# Patient Record
Sex: Male | Born: 1945 | Race: White | Hispanic: No | Marital: Married | Smoking: Former smoker
Health system: Southern US, Community
[De-identification: ages and names within clinical notes are randomized; demographics above are authoritative.]

## PROBLEM LIST (undated history)

## (undated) DIAGNOSIS — E559 Vitamin D deficiency, unspecified: Secondary | ICD-10-CM

## (undated) DIAGNOSIS — N400 Enlarged prostate without lower urinary tract symptoms: Secondary | ICD-10-CM

## (undated) DIAGNOSIS — E039 Hypothyroidism, unspecified: Secondary | ICD-10-CM

## (undated) DIAGNOSIS — N179 Acute kidney failure, unspecified: Secondary | ICD-10-CM

## (undated) DIAGNOSIS — I509 Heart failure, unspecified: Secondary | ICD-10-CM

## (undated) DIAGNOSIS — E538 Deficiency of other specified B group vitamins: Secondary | ICD-10-CM

## (undated) DIAGNOSIS — K5792 Diverticulitis of intestine, part unspecified, without perforation or abscess without bleeding: Secondary | ICD-10-CM

## (undated) DIAGNOSIS — D509 Iron deficiency anemia, unspecified: Secondary | ICD-10-CM

## (undated) DIAGNOSIS — I340 Nonrheumatic mitral (valve) insufficiency: Secondary | ICD-10-CM

## (undated) DIAGNOSIS — R748 Abnormal levels of other serum enzymes: Secondary | ICD-10-CM

## (undated) DIAGNOSIS — I499 Cardiac arrhythmia, unspecified: Secondary | ICD-10-CM

## (undated) DIAGNOSIS — I4891 Unspecified atrial fibrillation: Secondary | ICD-10-CM

## (undated) DIAGNOSIS — I429 Cardiomyopathy, unspecified: Secondary | ICD-10-CM

## (undated) DIAGNOSIS — I959 Hypotension, unspecified: Secondary | ICD-10-CM

## (undated) HISTORY — DX: Nonrheumatic mitral (valve) insufficiency: I34.0

## (undated) HISTORY — DX: Heart failure, unspecified: I50.9

## (undated) HISTORY — DX: Unspecified atrial fibrillation: I48.91

## (undated) HISTORY — DX: Vitamin D deficiency, unspecified: E55.9

## (undated) HISTORY — PX: HERNIA REPAIR: SHX51

## (undated) HISTORY — PX: CATARACT EXTRACTION: SUR2

## (undated) HISTORY — DX: Hypotension, unspecified: I95.9

## (undated) HISTORY — DX: Cardiomyopathy, unspecified: I42.9

## (undated) HISTORY — DX: Diverticulitis of intestine, part unspecified, without perforation or abscess without bleeding: K57.92

## (undated) HISTORY — DX: Abnormal levels of other serum enzymes: R74.8

## (undated) HISTORY — DX: Acute kidney failure, unspecified: N17.9

## (undated) HISTORY — DX: Cardiac arrhythmia, unspecified: I49.9

## (undated) HISTORY — DX: Hypothyroidism, unspecified: E03.9

## (undated) HISTORY — DX: Deficiency of other specified B group vitamins: E53.8

## (undated) HISTORY — DX: Benign prostatic hyperplasia without lower urinary tract symptoms: N40.0

## (undated) HISTORY — DX: Iron deficiency anemia, unspecified: D50.9

---

## 2013-08-28 DIAGNOSIS — H269 Unspecified cataract: Secondary | ICD-10-CM | POA: Insufficient documentation

## 2013-08-28 HISTORY — DX: Unspecified cataract: H26.9

## 2016-02-28 DIAGNOSIS — E559 Vitamin D deficiency, unspecified: Secondary | ICD-10-CM | POA: Diagnosis not present

## 2016-02-28 DIAGNOSIS — R739 Hyperglycemia, unspecified: Secondary | ICD-10-CM | POA: Diagnosis not present

## 2016-02-28 DIAGNOSIS — R972 Elevated prostate specific antigen [PSA]: Secondary | ICD-10-CM | POA: Diagnosis not present

## 2016-02-28 DIAGNOSIS — E063 Autoimmune thyroiditis: Secondary | ICD-10-CM | POA: Diagnosis not present

## 2016-02-28 DIAGNOSIS — R413 Other amnesia: Secondary | ICD-10-CM | POA: Diagnosis not present

## 2016-02-28 DIAGNOSIS — E538 Deficiency of other specified B group vitamins: Secondary | ICD-10-CM | POA: Diagnosis not present

## 2016-02-28 DIAGNOSIS — Z79899 Other long term (current) drug therapy: Secondary | ICD-10-CM | POA: Diagnosis not present

## 2016-05-29 DIAGNOSIS — E538 Deficiency of other specified B group vitamins: Secondary | ICD-10-CM | POA: Diagnosis not present

## 2016-05-29 DIAGNOSIS — Z1389 Encounter for screening for other disorder: Secondary | ICD-10-CM | POA: Diagnosis not present

## 2016-05-29 DIAGNOSIS — Z6823 Body mass index (BMI) 23.0-23.9, adult: Secondary | ICD-10-CM | POA: Diagnosis not present

## 2016-05-29 DIAGNOSIS — E559 Vitamin D deficiency, unspecified: Secondary | ICD-10-CM | POA: Diagnosis not present

## 2016-05-29 DIAGNOSIS — R413 Other amnesia: Secondary | ICD-10-CM | POA: Diagnosis not present

## 2016-05-29 DIAGNOSIS — E063 Autoimmune thyroiditis: Secondary | ICD-10-CM | POA: Diagnosis not present

## 2016-05-29 DIAGNOSIS — Z9181 History of falling: Secondary | ICD-10-CM | POA: Diagnosis not present

## 2016-10-02 DIAGNOSIS — E063 Autoimmune thyroiditis: Secondary | ICD-10-CM | POA: Diagnosis not present

## 2016-10-02 DIAGNOSIS — E538 Deficiency of other specified B group vitamins: Secondary | ICD-10-CM | POA: Diagnosis not present

## 2016-10-02 DIAGNOSIS — E663 Overweight: Secondary | ICD-10-CM | POA: Diagnosis not present

## 2016-10-02 DIAGNOSIS — Z6825 Body mass index (BMI) 25.0-25.9, adult: Secondary | ICD-10-CM | POA: Diagnosis not present

## 2016-10-02 DIAGNOSIS — R739 Hyperglycemia, unspecified: Secondary | ICD-10-CM | POA: Diagnosis not present

## 2016-10-02 DIAGNOSIS — E559 Vitamin D deficiency, unspecified: Secondary | ICD-10-CM | POA: Diagnosis not present

## 2016-10-02 DIAGNOSIS — R413 Other amnesia: Secondary | ICD-10-CM | POA: Diagnosis not present

## 2016-10-02 DIAGNOSIS — Z79899 Other long term (current) drug therapy: Secondary | ICD-10-CM | POA: Diagnosis not present

## 2016-11-30 DIAGNOSIS — Z961 Presence of intraocular lens: Secondary | ICD-10-CM | POA: Diagnosis not present

## 2016-11-30 DIAGNOSIS — H25042 Posterior subcapsular polar age-related cataract, left eye: Secondary | ICD-10-CM | POA: Diagnosis not present

## 2016-11-30 DIAGNOSIS — H2512 Age-related nuclear cataract, left eye: Secondary | ICD-10-CM | POA: Diagnosis not present

## 2016-11-30 DIAGNOSIS — H35372 Puckering of macula, left eye: Secondary | ICD-10-CM | POA: Diagnosis not present

## 2016-11-30 DIAGNOSIS — H25012 Cortical age-related cataract, left eye: Secondary | ICD-10-CM | POA: Diagnosis not present

## 2017-01-21 DIAGNOSIS — H2512 Age-related nuclear cataract, left eye: Secondary | ICD-10-CM

## 2017-01-21 HISTORY — DX: Age-related nuclear cataract, left eye: H25.12

## 2017-01-24 DIAGNOSIS — Z87891 Personal history of nicotine dependence: Secondary | ICD-10-CM | POA: Diagnosis not present

## 2017-01-24 DIAGNOSIS — H2512 Age-related nuclear cataract, left eye: Secondary | ICD-10-CM | POA: Diagnosis not present

## 2017-01-24 DIAGNOSIS — Z7982 Long term (current) use of aspirin: Secondary | ICD-10-CM | POA: Diagnosis not present

## 2017-01-24 DIAGNOSIS — K5730 Diverticulosis of large intestine without perforation or abscess without bleeding: Secondary | ICD-10-CM | POA: Diagnosis not present

## 2017-01-24 DIAGNOSIS — Z79899 Other long term (current) drug therapy: Secondary | ICD-10-CM | POA: Diagnosis not present

## 2017-01-24 DIAGNOSIS — E039 Hypothyroidism, unspecified: Secondary | ICD-10-CM | POA: Diagnosis not present

## 2017-02-01 DIAGNOSIS — J9801 Acute bronchospasm: Secondary | ICD-10-CM | POA: Diagnosis not present

## 2017-02-01 DIAGNOSIS — Z6826 Body mass index (BMI) 26.0-26.9, adult: Secondary | ICD-10-CM | POA: Diagnosis not present

## 2017-02-01 DIAGNOSIS — E063 Autoimmune thyroiditis: Secondary | ICD-10-CM | POA: Diagnosis not present

## 2017-02-01 DIAGNOSIS — E559 Vitamin D deficiency, unspecified: Secondary | ICD-10-CM | POA: Diagnosis not present

## 2017-02-01 DIAGNOSIS — Z79899 Other long term (current) drug therapy: Secondary | ICD-10-CM | POA: Diagnosis not present

## 2017-02-01 DIAGNOSIS — J189 Pneumonia, unspecified organism: Secondary | ICD-10-CM | POA: Diagnosis not present

## 2017-06-04 DIAGNOSIS — E559 Vitamin D deficiency, unspecified: Secondary | ICD-10-CM | POA: Diagnosis not present

## 2017-06-04 DIAGNOSIS — Z1331 Encounter for screening for depression: Secondary | ICD-10-CM | POA: Diagnosis not present

## 2017-06-04 DIAGNOSIS — E538 Deficiency of other specified B group vitamins: Secondary | ICD-10-CM | POA: Diagnosis not present

## 2017-06-04 DIAGNOSIS — E063 Autoimmune thyroiditis: Secondary | ICD-10-CM | POA: Diagnosis not present

## 2017-06-04 DIAGNOSIS — M7989 Other specified soft tissue disorders: Secondary | ICD-10-CM | POA: Diagnosis not present

## 2017-06-04 DIAGNOSIS — R413 Other amnesia: Secondary | ICD-10-CM | POA: Diagnosis not present

## 2017-06-04 DIAGNOSIS — R739 Hyperglycemia, unspecified: Secondary | ICD-10-CM | POA: Diagnosis not present

## 2017-06-04 DIAGNOSIS — Z79899 Other long term (current) drug therapy: Secondary | ICD-10-CM | POA: Diagnosis not present

## 2017-06-04 DIAGNOSIS — E663 Overweight: Secondary | ICD-10-CM | POA: Diagnosis not present

## 2017-06-04 DIAGNOSIS — Z9181 History of falling: Secondary | ICD-10-CM | POA: Diagnosis not present

## 2017-06-05 DIAGNOSIS — M19071 Primary osteoarthritis, right ankle and foot: Secondary | ICD-10-CM | POA: Diagnosis not present

## 2017-06-05 DIAGNOSIS — M7989 Other specified soft tissue disorders: Secondary | ICD-10-CM | POA: Diagnosis not present

## 2017-08-15 ENCOUNTER — Encounter: Payer: Self-pay | Admitting: Family Medicine

## 2017-08-15 DIAGNOSIS — R7989 Other specified abnormal findings of blood chemistry: Secondary | ICD-10-CM | POA: Diagnosis not present

## 2017-08-15 DIAGNOSIS — E039 Hypothyroidism, unspecified: Secondary | ICD-10-CM | POA: Diagnosis not present

## 2017-08-15 DIAGNOSIS — I509 Heart failure, unspecified: Secondary | ICD-10-CM | POA: Diagnosis not present

## 2017-08-15 DIAGNOSIS — Z6825 Body mass index (BMI) 25.0-25.9, adult: Secondary | ICD-10-CM | POA: Diagnosis not present

## 2017-08-15 DIAGNOSIS — R0602 Shortness of breath: Secondary | ICD-10-CM | POA: Diagnosis not present

## 2017-08-15 DIAGNOSIS — R0902 Hypoxemia: Secondary | ICD-10-CM | POA: Diagnosis not present

## 2017-08-15 DIAGNOSIS — J9601 Acute respiratory failure with hypoxia: Secondary | ICD-10-CM | POA: Diagnosis not present

## 2017-08-15 DIAGNOSIS — K7200 Acute and subacute hepatic failure without coma: Secondary | ICD-10-CM | POA: Diagnosis not present

## 2017-08-15 DIAGNOSIS — E872 Acidosis: Secondary | ICD-10-CM | POA: Diagnosis not present

## 2017-08-15 DIAGNOSIS — R531 Weakness: Secondary | ICD-10-CM | POA: Diagnosis not present

## 2017-08-15 DIAGNOSIS — R570 Cardiogenic shock: Secondary | ICD-10-CM | POA: Diagnosis not present

## 2017-08-15 DIAGNOSIS — I491 Atrial premature depolarization: Secondary | ICD-10-CM | POA: Diagnosis not present

## 2017-08-15 DIAGNOSIS — R002 Palpitations: Secondary | ICD-10-CM | POA: Diagnosis not present

## 2017-08-15 DIAGNOSIS — K5792 Diverticulitis of intestine, part unspecified, without perforation or abscess without bleeding: Secondary | ICD-10-CM | POA: Diagnosis not present

## 2017-08-15 DIAGNOSIS — R000 Tachycardia, unspecified: Secondary | ICD-10-CM | POA: Diagnosis not present

## 2017-08-15 DIAGNOSIS — I4891 Unspecified atrial fibrillation: Secondary | ICD-10-CM | POA: Diagnosis not present

## 2017-08-15 DIAGNOSIS — I5021 Acute systolic (congestive) heart failure: Secondary | ICD-10-CM | POA: Diagnosis not present

## 2017-08-15 DIAGNOSIS — N179 Acute kidney failure, unspecified: Secondary | ICD-10-CM | POA: Diagnosis not present

## 2017-08-15 DIAGNOSIS — R748 Abnormal levels of other serum enzymes: Secondary | ICD-10-CM | POA: Diagnosis not present

## 2017-08-16 DIAGNOSIS — R0602 Shortness of breath: Secondary | ICD-10-CM

## 2017-08-16 DIAGNOSIS — I4891 Unspecified atrial fibrillation: Secondary | ICD-10-CM

## 2017-08-16 DIAGNOSIS — I509 Heart failure, unspecified: Secondary | ICD-10-CM

## 2017-08-18 DIAGNOSIS — I4891 Unspecified atrial fibrillation: Secondary | ICD-10-CM | POA: Diagnosis not present

## 2017-08-18 DIAGNOSIS — I509 Heart failure, unspecified: Secondary | ICD-10-CM

## 2017-08-21 DIAGNOSIS — R945 Abnormal results of liver function studies: Secondary | ICD-10-CM | POA: Diagnosis not present

## 2017-08-21 DIAGNOSIS — E538 Deficiency of other specified B group vitamins: Secondary | ICD-10-CM | POA: Diagnosis not present

## 2017-08-21 DIAGNOSIS — E063 Autoimmune thyroiditis: Secondary | ICD-10-CM | POA: Diagnosis not present

## 2017-08-21 DIAGNOSIS — I4891 Unspecified atrial fibrillation: Secondary | ICD-10-CM | POA: Diagnosis not present

## 2017-08-21 DIAGNOSIS — Z79899 Other long term (current) drug therapy: Secondary | ICD-10-CM | POA: Diagnosis not present

## 2017-08-21 DIAGNOSIS — D649 Anemia, unspecified: Secondary | ICD-10-CM | POA: Diagnosis not present

## 2017-08-21 DIAGNOSIS — I509 Heart failure, unspecified: Secondary | ICD-10-CM | POA: Diagnosis not present

## 2017-08-21 DIAGNOSIS — K5792 Diverticulitis of intestine, part unspecified, without perforation or abscess without bleeding: Secondary | ICD-10-CM | POA: Diagnosis not present

## 2017-08-21 DIAGNOSIS — E559 Vitamin D deficiency, unspecified: Secondary | ICD-10-CM | POA: Diagnosis not present

## 2017-08-27 NOTE — Progress Notes (Signed)
Cardiology Office Note:    Date:  08/29/2017   ID:  Jonathan Patterson, DOB April 02, 1945, MRN [id number]  PCP:  Guadalupe Maple., MD  Cardiologist:  Norman Herrlich, MD   Referring MD: No ref. provider found  ASSESSMENT:    1. PAF (paroxysmal atrial fibrillation) (HCC)   2. Chronic systolic heart failure (HCC)   3. Dilated cardiomyopathy (HCC)    PLAN:    In order of problems listed above:  1. Stable he remains in sinus rhythm we will continue his current anticoagulant 2. Compensated he has no fluid overload New York Heart Association class II continue his current medical treatment and as discussed in hospital referral for cardiac CTA to evaluate CAD although clinically his cardiomyopathy appeared to be secondary to rapid atrial fibrillation we will check renal function and proBNP level today 3. Continue current medical treatment loop diuretic beta-blocker ACE inhibitor and MRA recheck ejection fraction by echo at his next visit.  If unimproved will need to consider the merits of referral for ICD  Next 6 weeks after CTA   Medication Adjustments/Labs and Tests Ordered: Current medicines are reviewed at length with the patient today.  Concerns regarding medicines are outlined above.  Orders Placed This Encounter  Procedures  . CT CORONARY MORPH W/CTA COR W/SCORE W/CA W/CM &/OR WO/CM  . CT CORONARY FRACTIONAL FLOW RESERVE DATA PREP  . CT CORONARY FRACTIONAL FLOW RESERVE FLUID ANALYSIS  . Basic metabolic panel  . Pro b natriuretic peptide (BNP)  . EKG 12-Lead   No orders of the defined types were placed in this encounter.    Chief Complaint  Patient presents with  . Hospitalization Follow-up    recent [facility] admisssion  . Congestive Heart Failure  . Atrial Fibrillation    History of Present Illness:    Jonathan Patterson is a 72 y.o. male who is being seen today for the evaluation of heart failuer and atrial fibrillation after [facility] admission 08/15/17.  Recent [facility] admission  08/15/2017 he was in atrial fibrillation with a rapid ventricular response decompensated heart failure acute kidney injury acute liver injury and with control of his heart rate and treatment of heart failure he improved in hospital.  He remained in atrial fibrillation rate controlled his initial BNP level 9960 prior to discharge 1630 liver function improved the transaminases were still elevated at the time of discharge from the hospital he was anticoagulated with the plan to come back and see me in the office in anticipation of outpatient cardioversion to sinus rhythm.  While hospitalized an echocardiogram was performed ejection fraction was less than 20% .  Overall he has felt well but he fatigues easily tries to do activities outdoor and hot humid weather.  He is short of breath doing gardening work no edema orthopnea chest pain palpitation or syncope weighs daily and his weight is down 45 pounds since hospital discharge he restrict sodium in his diet and his wife supervises medications. Past Medical History:  Diagnosis Date  . Acute kidney injury (HCC)   . Arrhythmia   . Atrial fibrillation with RVR (HCC)   . Cardiomyopathy (HCC)   . Congestive heart failure (CHF) (HCC)   . Diverticulitis   . Elevated liver enzymes   . Hypotension   . Hypothyroid   . Iron deficiency anemia   . Mitral regurgitation     Past Surgical History:  Procedure Laterality Date  . CATARACT EXTRACTION Bilateral     Current Medications: Current Meds  Medication Sig  . Calcium Carb-Cholecalciferol (CALCIUM + D3 PO) Take 1 tablet by mouth daily.  . Cyanocobalamin (VITAMIN B12) 1000 MCG TBCR Take 1 tablet by mouth daily.  Marland Kitchen. ELIQUIS 5 MG TABS tablet Take 5 mg by mouth 2 (two) times daily.  . furosemide (LASIX) 40 MG tablet Take 40 mg by mouth daily.  Marland Kitchen. levothyroxine (SYNTHROID, LEVOTHROID) 100 MCG tablet Take 100 mcg by mouth daily.  Marland Kitchen. lisinopril (PRINIVIL,ZESTRIL) 2.5 MG tablet Take 2.5 mg by mouth daily.  .  metoprolol tartrate (LOPRESSOR) 25 MG tablet Take 25 mg by mouth 2 (two) times daily.  Marland Kitchen. spironolactone (ALDACTONE) 25 MG tablet Take 12.5 mg by mouth daily.     Allergies:   Patient has no known allergies.   Social History   Socioeconomic History  . Marital status: Unknown    Spouse name: Not on file  . Number of children: Not on file  . Years of education: Not on file  . Highest education level: Not on file  Occupational History  . Not on file  Social Needs  . Financial resource strain: Not on file  . Food insecurity:    Worry: Not on file    Inability: Not on file  . Transportation needs:    Medical: Not on file    Non-medical: Not on file  Tobacco Use  . Smoking status: Former Smoker    Packs/day: 3.00    Last attempt to quit: 1998    Years since quitting: 21.5  . Smokeless tobacco: Never Used  Substance and Sexual Activity  . Alcohol use: Not Currently  . Drug use: Not Currently  . Sexual activity: Not on file  Lifestyle  . Physical activity:    Days per week: Not on file    Minutes per session: Not on file  . Stress: Not on file  Relationships  . Social connections:    Talks on phone: Not on file    Gets together: Not on file    Attends religious service: Not on file    Active member of club or organization: Not on file    Attends meetings of clubs or organizations: Not on file    Relationship status: Not on file  Other Topics Concern  . Not on file  Social History Narrative  . Not on file     Family History: The patient'sfamily history includes Asthma in his daughter; Cancer in his mother; Diabetes in his father and sister.  ROS:   ROS Please see the history of present illness.    All other systems reviewed and are negative.  EKGs/Labs/Other Studies Reviewed:    The following studies were reviewed today:   EKG:  EKG is  ordered today.  The ekg ordered today demonstrates [facility]  Non slpecific T waves  Recent Labs: 08/28/2017: BUN 33; Creatinine,  Ser 1.13; NT-Pro BNP 770; Potassium 5.6; Sodium 126  Recent Lipid Panel No results found for: CHOL, TRIG, HDL, CHOLHDL, VLDL, LDLCALC, LDLDIRECT  Physical Exam:    VS:  BP 90/70 (BP Location: Left Arm, Patient Position: Sitting, Cuff Size: Normal)   Pulse 71   Ht 6' (1.829 m)   Wt 165 lb 12.8 oz (75.2 kg)   SpO2 90%   BMI 22.49 kg/m     Wt Readings from Last 3 Encounters:  08/28/17 165 lb 12.8 oz (75.2 kg)     GEN:  Well nourished, well developed in no acute distress HEENT: Normal NECK: No JVD; No carotid bruits  LYMPHATICS: No lymphadenopathy CARDIAC: RRR, no murmurs, rubs, gallops RESPIRATORY:  Clear to auscultation without rales, wheezing or rhonchi  ABDOMEN: Soft, non-tender, non-distended MUSCULOSKELETAL:  No edema; No deformity  SKIN: Warm and dry NEUROLOGIC:  Alert and oriented x 3 PSYCHIATRIC:  Normal affect     Signed, Norman Herrlich, MD  08/29/2017 8:36 AM    [facility]

## 2017-08-28 ENCOUNTER — Encounter: Payer: Self-pay | Admitting: Cardiology

## 2017-08-28 ENCOUNTER — Ambulatory Visit (INDEPENDENT_AMBULATORY_CARE_PROVIDER_SITE_OTHER): Payer: Medicare HMO | Admitting: Cardiology

## 2017-08-28 VITALS — BP 90/70 | HR 71 | Ht 72.0 in | Wt 165.8 lb

## 2017-08-28 DIAGNOSIS — I480 Paroxysmal atrial fibrillation: Secondary | ICD-10-CM | POA: Diagnosis not present

## 2017-08-28 DIAGNOSIS — I5022 Chronic systolic (congestive) heart failure: Secondary | ICD-10-CM

## 2017-08-28 DIAGNOSIS — I420 Dilated cardiomyopathy: Secondary | ICD-10-CM | POA: Diagnosis not present

## 2017-08-28 NOTE — Patient Instructions (Addendum)
Medication Instructions:  Your physician recommends that you continue on your current medications as directed. Please refer to the Current Medication list given to you today.  Labwork: Your physician recommends that you have the following labs drawn: BMP and ProBNP  Testing/Procedures: [facility] (you will be called with an appointment date and time once insurance has approved) (30-45 minutes prior to test start time)  [facility] [address] [place], [place] [address] [phone]  Proceed to the [facility] Radiology Department (First Floor).  Please follow these instructions carefully (unless otherwise directed):  Hold all erectile dysfunction medications at least 48 hours prior to test.  On the Night Before the Test: . Drink plenty of water. . Do not consume any caffeinated/decaffeinated beverages or chocolate 12 hours prior to your test. . Do not take any antihistamines 12 hours prior to your test.  On the Day of the Test: . Drink plenty of water. Do not drink any water within one hour of the test. . Do not eat any food 4 hours prior to the test. . You may take your regular medications prior to the test. . IF NOT ON A BETA BLOCKER - Take 50 mg of lopressor (metoprolol) one hour before the test. . HOLD Furosemide and spironolactone the morning of the test.  After the Test: . Drink plenty of water. . After receiving IV contrast, you may experience a mild flushed feeling. This is normal. . On occasion, you may experience a mild rash up to 24 hours after the test. This is not dangerous. If this occurs, you can take Benadryl 25 mg and increase your fluid intake. . If you experience trouble breathing, this can be serious. If it is severe call 911 IMMEDIATELY. If it is mild, please call our office. . If you take any of these medications: Glipizide/Metformin, Avandament, Glucavance, please do not take 48  hours after completing test.   Follow-Up: Your physician recommends that you schedule a follow-up appointment in: 1 month  Any Other Special Instructions Will Be Listed Below (If Applicable).     If you need a refill on your cardiac medications before your next appointment, please call your pharmacy.   [facility]  Garey HamAshley A, RN, BSN  Heart Failure  Weigh yourself every morning when you first wake up and record on a calender or note pad, bring this to your office visits. Using a pill tender can help with taking your medications consistently.  Limit your fluid intake to 2 liters daily  Limit your sodium intake to less than 2-3 grams daily. Ask if you need dietary teaching.  If you gain more than 3 pounds (from your dry weight ), double your dose of diuretic for the day.  If you gain more than 5 pounds (from your dry weight), double your dose of lasix and call your heart failure doctor.  Please do not smoke tobacco since it is very bad for your heart.  Please do not drink alcohol since it can worsen your heart failure.Also avoid OTC nonsteroidal drugs, such as advil, aleve and motrin.  Try to exercise for at least 30 minutes every day because this will help your heart be more efficient. You may be eligible for supervised cardiac rehab, ask your physician.

## 2017-08-29 ENCOUNTER — Encounter: Payer: Self-pay | Admitting: Cardiology

## 2017-08-29 ENCOUNTER — Telehealth: Payer: Self-pay | Admitting: *Deleted

## 2017-08-29 DIAGNOSIS — I5022 Chronic systolic (congestive) heart failure: Secondary | ICD-10-CM

## 2017-08-29 DIAGNOSIS — I420 Dilated cardiomyopathy: Secondary | ICD-10-CM

## 2017-08-29 LAB — BASIC METABOLIC PANEL
BUN/Creatinine Ratio: 29 — ABNORMAL HIGH (ref 10–24)
BUN: 33 mg/dL — AB (ref 8–27)
CO2: 25 mmol/L (ref 20–29)
CREATININE: 1.13 mg/dL (ref 0.76–1.27)
Calcium: 9.3 mg/dL (ref 8.6–10.2)
Chloride: 87 mmol/L — ABNORMAL LOW (ref 96–106)
GFR calc Af Amer: 76 mL/min/{1.73_m2} (ref >59)
GFR, EST NON AFRICAN AMERICAN: 65 mL/min/{1.73_m2} (ref >59)
Glucose: 88 mg/dL (ref 65–99)
Potassium: 5.6 mmol/L — ABNORMAL HIGH (ref 3.5–5.2)
SODIUM: 126 mmol/L — AB (ref 134–144)

## 2017-08-29 LAB — PRO B NATRIURETIC PEPTIDE: NT-Pro BNP: 770 pg/mL — ABNORMAL HIGH (ref 0–376)

## 2017-08-29 NOTE — Telephone Encounter (Signed)
-----   Message from Baldo DaubBrian J Munley, MD sent at 08/29/2017 12:08 PM EDT ----- Stop spironolactone recheck next Monday

## 2017-08-29 NOTE — Telephone Encounter (Signed)
Patient's wife, Lucendia HerrlichFaye, informed of lab results and advised to stop taking spironolactone and go for repeat lab work on 09/04/17.  Lucendia HerrlichFaye verbalized understanding. No further questions.

## 2017-09-03 ENCOUNTER — Telehealth: Payer: Self-pay | Admitting: Cardiology

## 2017-09-03 DIAGNOSIS — I420 Dilated cardiomyopathy: Secondary | ICD-10-CM | POA: Diagnosis not present

## 2017-09-03 DIAGNOSIS — I5022 Chronic systolic (congestive) heart failure: Secondary | ICD-10-CM | POA: Diagnosis not present

## 2017-09-03 LAB — BASIC METABOLIC PANEL
BUN/Creatinine Ratio: 26 — ABNORMAL HIGH (ref 10–24)
BUN: 26 mg/dL (ref 8–27)
CALCIUM: 9.6 mg/dL (ref 8.6–10.2)
CHLORIDE: 93 mmol/L — AB (ref 96–106)
CO2: 28 mmol/L (ref 20–29)
Creatinine, Ser: 1.01 mg/dL (ref 0.76–1.27)
GFR calc Af Amer: 87 mL/min/{1.73_m2} (ref >59)
GFR calc non Af Amer: 75 mL/min/{1.73_m2} (ref >59)
GLUCOSE: 79 mg/dL (ref 65–99)
Potassium: 5.7 mmol/L — ABNORMAL HIGH (ref 3.5–5.2)
Sodium: 134 mmol/L (ref 134–144)

## 2017-09-03 NOTE — Telephone Encounter (Signed)
Please call patient he hs ins autho for C and has not gotten an appt yet??

## 2017-09-05 NOTE — Telephone Encounter (Signed)
Message has been sent to Omar PersonSharon Ferguson regarding scheduling.

## 2017-09-05 NOTE — Telephone Encounter (Signed)
Informed the wife of their appointment date and time.

## 2017-09-25 ENCOUNTER — Ambulatory Visit (HOSPITAL_COMMUNITY)
Admission: RE | Admit: 2017-09-25 | Discharge: 2017-09-25 | Disposition: A | Discharge status: Home / Self Care | Payer: Medicare HMO | Source: Ambulatory Visit | Attending: Cardiology | Admitting: Cardiology

## 2017-09-25 ENCOUNTER — Ambulatory Visit (HOSPITAL_COMMUNITY): Payer: Medicare HMO

## 2017-09-25 ENCOUNTER — Encounter (HOSPITAL_COMMUNITY): Payer: Self-pay

## 2017-09-25 DIAGNOSIS — I420 Dilated cardiomyopathy: Secondary | ICD-10-CM | POA: Diagnosis not present

## 2017-09-25 DIAGNOSIS — I429 Cardiomyopathy, unspecified: Secondary | ICD-10-CM | POA: Diagnosis not present

## 2017-09-25 MED ORDER — METOPROLOL TARTRATE 5 MG/5ML IV SOLN
5.0000 mg | INTRAVENOUS | Status: DC | PRN
  Administered 2017-09-25 (×2): 5 mg via INTRAVENOUS
  Filled 2017-09-25 (×2): qty 5

## 2017-09-25 MED ORDER — NITROGLYCERIN 0.4 MG SL SUBL
0.8000 mg | SUBLINGUAL_TABLET | Freq: Once | SUBLINGUAL | Status: AC
  Administered 2017-09-25: 0.8 mg via SUBLINGUAL
  Filled 2017-09-25: qty 25

## 2017-09-25 MED ORDER — NITROGLYCERIN 0.4 MG SL SUBL
SUBLINGUAL_TABLET | SUBLINGUAL | Status: AC
  Administered 2017-09-25: 0.8 mg via SUBLINGUAL
  Filled 2017-09-25: qty 2

## 2017-09-25 MED ORDER — IOPAMIDOL (ISOVUE-370) INJECTION 76%
INTRAVENOUS | Status: AC
  Administered 2017-09-25: 80 mL
  Filled 2017-09-25: qty 100

## 2017-09-25 MED ORDER — METOPROLOL TARTRATE 5 MG/5ML IV SOLN
INTRAVENOUS | Status: AC
  Administered 2017-09-25: 5 mg via INTRAVENOUS
  Filled 2017-09-25: qty 15

## 2017-09-27 ENCOUNTER — Encounter: Payer: Self-pay | Admitting: Cardiology

## 2017-09-27 ENCOUNTER — Ambulatory Visit (INDEPENDENT_AMBULATORY_CARE_PROVIDER_SITE_OTHER): Payer: Medicare HMO | Admitting: Cardiology

## 2017-09-27 VITALS — BP 104/70 | HR 77 | Ht 72.0 in | Wt 166.8 lb

## 2017-09-27 DIAGNOSIS — I5022 Chronic systolic (congestive) heart failure: Secondary | ICD-10-CM | POA: Diagnosis not present

## 2017-09-27 DIAGNOSIS — I2510 Atherosclerotic heart disease of native coronary artery without angina pectoris: Secondary | ICD-10-CM

## 2017-09-27 DIAGNOSIS — I420 Dilated cardiomyopathy: Secondary | ICD-10-CM | POA: Diagnosis not present

## 2017-09-27 DIAGNOSIS — Z7901 Long term (current) use of anticoagulants: Secondary | ICD-10-CM

## 2017-09-27 DIAGNOSIS — I480 Paroxysmal atrial fibrillation: Secondary | ICD-10-CM | POA: Diagnosis not present

## 2017-09-27 NOTE — Patient Instructions (Signed)
Medication Instructions:  Your physician recommends that you continue on your current medications as directed. Please refer to the Current Medication list given to you today.   Labwork: You will have lab work today  Testing/Procedures: Your physician has requested that you have an echocardiogram. Echocardiography is a painless test that uses sound waves to create images of your heart. It provides your doctor with information about the size and shape of your heart and how well your heart's chambers and valves are working. This procedure takes approximately one hour. There are no restrictions for this procedure.    Follow-Up: Your physician recommends that you schedule a follow-up appointment in: 6 weeks    Any Other Special Instructions Will Be Listed Below (If Applicable).     If you need a refill on your cardiac medications before your next appointment, please call your pharmacy.

## 2017-09-27 NOTE — Progress Notes (Signed)
Cardiology Office Note:    Date:  09/27/2017   ID:  Jonathan Patterson, DOB 12/30/1945, MRN [id number]  PCP:  Guadalupe MapleGage, John F., MD  Cardiologist:  Norman HerrlichBrian Lukis Bunt, MD    Referring MD: Guadalupe MapleGage, John F., MD    ASSESSMENT:    1. Chronic systolic heart failure (HCC)   2. Dilated cardiomyopathy (HCC)   3. PAF (paroxysmal atrial fibrillation) (HCC)   4. Chronic anticoagulation   5. Mild CAD    PLAN:    In order of problems listed above:  1. Stable compensated continue current treatment with his blood pressure would not try to uptitrate ACE inhibitor recheck renal function with recent hyperkalemia and plan echocardiogram 3 months after the onset of cardiomyopathy if EF remains less than 35 consider the merits of ICD 2. See above continue treatment with diuretic beta-blocker ACE inhibitor 3. Stable no recurrence continue beta-blocker anticoagulant 4. Continue his anticoagulant 5. Reassuring CTA no need to consider revascularization   Next appointment: 6 to 8 weeks   Medication Adjustments/Labs and Tests Ordered: Current medicines are reviewed at length with the patient today.  Concerns regarding medicines are outlined above.  No orders of the defined types were placed in this encounter.  No orders of the defined types were placed in this encounter.   Chief Complaint  Patient presents with  . Follow-up    recent cardiac CTA  . Congestive Heart Failure  . Coronary Artery Disease  . Abnormal ECG  . Anticoagulation    History of Present Illness:    Jonathan Patterson is a 72 y.o. male with a hx of heart failure and atrial fibrillation last seen last month..  [facility]2017 he was in atrial fibrillation with a rapid ventricular response decompensated heart failure acute kidney injury acute liver injury and with control of his heart rate and treatment of heart failure he improved in hospital.  He remained in atrial fibrillation rate controlled his initial BNP  level 9960 prior to discharge 1630 liver function improved the transaminases were still elevated at the time of discharge from the hospital he was anticoagulated with the plan to come back and see me in the office in anticipation of outpatient cardioversion to sinus rhythm.  While hospitalized an echocardiogram was performed ejection fraction was less than 20% .   Follow-up cardiac CTA performed 09/25/2017 shows a very low calcium score 5 and mild nonobstructive CAD.  Compliance with diet, lifestyle and medications: Yes  He continues to improve no weakness exercise intolerance dyspnea edema chest pain palpitation or syncope.  He had a marked improvement in his BNP level less than 10% of his baseline and cardiac CTA does not show significant CAD Past Medical History:  Diagnosis Date  . Acute kidney injury (HCC)   . Arrhythmia   . Atrial fibrillation with RVR (HCC)   . Cardiomyopathy (HCC)   . Congestive heart failure (CHF) (HCC)   . Diverticulitis   . Elevated liver enzymes   . Hypotension   . Hypothyroid   . Iron deficiency anemia   . Mitral regurgitation     Past Surgical History:  Procedure Laterality Date  . CATARACT EXTRACTION Bilateral     Current Medications: Current Meds  Medication Sig  . Calcium Carb-Cholecalciferol (CALCIUM + D3 PO) Take 1 tablet by mouth daily.  . Cyanocobalamin (VITAMIN B12) 1000 MCG TBCR Take 1 tablet by mouth daily.  Marland Kitchen. ELIQUIS 5 MG TABS tablet Take 5 mg by  mouth 2 (two) times daily.  . furosemide (LASIX) 40 MG tablet Take 40 mg by mouth daily.  Marland Kitchen levothyroxine (SYNTHROID, LEVOTHROID) 100 MCG tablet Take 100 mcg by mouth daily.  Marland Kitchen lisinopril (PRINIVIL,ZESTRIL) 2.5 MG tablet Take 2.5 mg by mouth daily.  . metoprolol tartrate (LOPRESSOR) 25 MG tablet Take 25 mg by mouth 2 (two) times daily.     Allergies:   Patient has no known allergies.   Social History   Socioeconomic History  . Marital status: Unknown    Spouse name: Not on file  . Number  of children: Not on file  . Years of education: Not on file  . Highest education level: Not on file  Occupational History  . Not on file  Social Needs  . Financial resource strain: Not on file  . Food insecurity:    Worry: Not on file    Inability: Not on file  . Transportation needs:    Medical: Not on file    Non-medical: Not on file  Tobacco Use  . Smoking status: Former Smoker    Packs/day: 3.00    Last attempt to quit: 1998    Years since quitting: 21.6  . Smokeless tobacco: Never Used  Substance and Sexual Activity  . Alcohol use: Not Currently  . Drug use: Not Currently  . Sexual activity: Not on file  Lifestyle  . Physical activity:    Days per week: Not on file    Minutes per session: Not on file  . Stress: Not on file  Relationships  . Social connections:    Talks on phone: Not on file    Gets together: Not on file    Attends religious service: Not on file    Active member of club or organization: Not on file    Attends meetings of clubs or organizations: Not on file    Relationship status: Not on file  Other Topics Concern  . Not on file  Social History Narrative  . Not on file     Family History: The patient's family history includes Asthma in his daughter; Cancer in his mother; Diabetes in his father and sister. ROS:   Please see the history of present illness.    All other systems reviewed and are negative.  EKGs/Labs/Other Studies Reviewed:    The following studies were reviewed today:  Recent Labs: 08/28/2017: NT-Pro BNP 770 09/03/2017: BUN 26; Creatinine, Ser 1.01; Potassium 5.7; Sodium 134  Recent Lipid Panel No results found for: CHOL, TRIG, HDL, CHOLHDL, VLDL, LDLCALC, LDLDIRECT  Physical Exam:    VS:  Wt 166 lb 12.8 oz (75.7 kg)   BMI 22.62 kg/m     Wt Readings from Last 3 Encounters:  09/27/17 166 lb 12.8 oz (75.7 kg)  08/28/17 165 lb 12.8 oz (75.2 kg)     GEN:  Well nourished, well developed in no acute distress HEENT:  Normal NECK: No JVD; No carotid bruits LYMPHATICS: No lymphadenopathy CARDIAC: RRR, no murmurs, rubs, gallops RESPIRATORY:  Clear to auscultation without rales, wheezing or rhonchi  ABDOMEN: Soft, non-tender, non-distended MUSCULOSKELETAL:  No edema; No deformity  SKIN: Warm and dry NEUROLOGIC:  Alert and oriented x 3 PSYCHIATRIC:  Normal affect    Signed, Norman Herrlich, MD  09/27/2017 1:24 PM    [facility]

## 2017-09-28 LAB — BASIC METABOLIC PANEL
BUN / CREAT RATIO: 17 (ref 10–24)
BUN: 15 mg/dL (ref 8–27)
CALCIUM: 9.7 mg/dL (ref 8.6–10.2)
CO2: 27 mmol/L (ref 20–29)
CREATININE: 0.87 mg/dL (ref 0.76–1.27)
Chloride: 94 mmol/L — ABNORMAL LOW (ref 96–106)
GFR calc Af Amer: 101 mL/min/{1.73_m2} (ref >59)
GFR calc non Af Amer: 87 mL/min/{1.73_m2} (ref >59)
GLUCOSE: 84 mg/dL (ref 65–99)
Potassium: 5.3 mmol/L — ABNORMAL HIGH (ref 3.5–5.2)
Sodium: 135 mmol/L (ref 134–144)

## 2017-09-28 LAB — PRO B NATRIURETIC PEPTIDE: NT-Pro BNP: 198 pg/mL (ref 0–376)

## 2017-10-02 DIAGNOSIS — E063 Autoimmune thyroiditis: Secondary | ICD-10-CM | POA: Diagnosis not present

## 2017-10-02 DIAGNOSIS — I509 Heart failure, unspecified: Secondary | ICD-10-CM | POA: Diagnosis not present

## 2017-10-02 DIAGNOSIS — E559 Vitamin D deficiency, unspecified: Secondary | ICD-10-CM | POA: Diagnosis not present

## 2017-10-02 DIAGNOSIS — E538 Deficiency of other specified B group vitamins: Secondary | ICD-10-CM | POA: Diagnosis not present

## 2017-10-02 DIAGNOSIS — Z6823 Body mass index (BMI) 23.0-23.9, adult: Secondary | ICD-10-CM | POA: Diagnosis not present

## 2017-10-02 DIAGNOSIS — D649 Anemia, unspecified: Secondary | ICD-10-CM | POA: Diagnosis not present

## 2017-10-02 DIAGNOSIS — Z79899 Other long term (current) drug therapy: Secondary | ICD-10-CM | POA: Diagnosis not present

## 2017-10-02 DIAGNOSIS — I4891 Unspecified atrial fibrillation: Secondary | ICD-10-CM | POA: Diagnosis not present

## 2017-10-22 ENCOUNTER — Ambulatory Visit (INDEPENDENT_AMBULATORY_CARE_PROVIDER_SITE_OTHER): Payer: Medicare HMO

## 2017-10-22 DIAGNOSIS — I5022 Chronic systolic (congestive) heart failure: Secondary | ICD-10-CM

## 2017-10-22 DIAGNOSIS — I420 Dilated cardiomyopathy: Secondary | ICD-10-CM

## 2017-10-22 DIAGNOSIS — I2510 Atherosclerotic heart disease of native coronary artery without angina pectoris: Secondary | ICD-10-CM

## 2017-10-22 DIAGNOSIS — I480 Paroxysmal atrial fibrillation: Secondary | ICD-10-CM | POA: Diagnosis not present

## 2017-10-22 DIAGNOSIS — Z7901 Long term (current) use of anticoagulants: Secondary | ICD-10-CM

## 2017-10-22 NOTE — Progress Notes (Signed)
Complete echocardiogram has been performed.  Jimmy Zacari Stiff RDCS, RVT 

## 2017-11-08 NOTE — Progress Notes (Signed)
Cardiology Office Note:    Date:  11/09/2017   ID:  WEAVER TWEED, DOB May 15, 1945, MRN [id number]  PCP:  Guadalupe Maple., MD  Cardiologist:  Norman Herrlich, MD    Referring MD: Guadalupe Maple., MD    ASSESSMENT:    1. PAF (paroxysmal atrial fibrillation) (HCC)   2. Chronic systolic heart failure (HCC)   3. Chronic anticoagulation   4. Dilated cardiomyopathy (HCC)    PLAN:    In order of problems listed above:  1. Stable remains in sinus rhythm continue beta-blocker anticoagulant 2. Stable improved EF is normalized he reduce his diuretic dose to 50% and continue beta-blocker ACE inhibitor with previous severe dilated cardiomyopathy in the setting of rapid atrial fibrillation 3. Continue his anticoagulant 4. Improved EF is normalized   Next appointment: 6 months   Medication Adjustments/Labs and Tests Ordered: Current medicines are reviewed at length with the patient today.  Concerns regarding medicines are outlined above.  Orders Placed This Encounter  Procedures  . Basic metabolic panel  . Pro b natriuretic peptide (BNP)   Meds ordered this encounter  Medications  . furosemide (LASIX) 40 MG tablet    Sig: Take 1 tablet on Mon, Wed, and Friday.  Take 1 tablet other days if weight is 164 lbs or greater    Dispense:  30 tablet    Refill:  5    Chief Complaint  Patient presents with  . Atrial Fibrillation  . Congestive Heart Failure    History of Present Illness:    Jonathan Patterson is a 72 y.o. male with a hx of mild CAD, PAF and cardiomyopathy with heart failure from rapid AF last seen 09/27/17.  Subsequent echocardiogram 10/22/2017 showed marked improvement that his previous severe left ventricular dysfunction had normalized with an ejection fraction of 55 to 60% with normal left ventricular size. Compliance with diet, lifestyle and medications: Yes  He is fully recovered no longer short of breath no edema weight stable at home no palpitation orthopnea and takes his  anticoagulant without bleeding complication.  He plans to return to work. Past Medical History:  Diagnosis Date  . Acute kidney injury (HCC)   . Arrhythmia   . Atrial fibrillation with RVR (HCC)   . Cardiomyopathy (HCC)   . Congestive heart failure (CHF) (HCC)   . Diverticulitis   . Elevated liver enzymes   . Hypotension   . Hypothyroid   . Iron deficiency anemia   . Mitral regurgitation     Past Surgical History:  Procedure Laterality Date  . CATARACT EXTRACTION Bilateral     Current Medications: Current Meds  Medication Sig  . Calcium Carb-Cholecalciferol (CALCIUM + D3 PO) Take 1 tablet by mouth daily.  . Cyanocobalamin (VITAMIN B12) 1000 MCG TBCR Take 1 tablet by mouth daily.  Marland Kitchen ELIQUIS 5 MG TABS tablet Take 5 mg by mouth 2 (two) times daily.  . furosemide (LASIX) 40 MG tablet Take 1 tablet on Mon, Wed, and Friday.  Take 1 tablet other days if weight is 164 lbs or greater  . levothyroxine (SYNTHROID, LEVOTHROID) 100 MCG tablet Take 100 mcg by mouth daily.  Marland Kitchen lisinopril (PRINIVIL,ZESTRIL) 2.5 MG tablet Take 2.5 mg by mouth daily.  . metoprolol tartrate (LOPRESSOR) 25 MG tablet Take 25 mg by mouth 2 (two) times daily.  . [DISCONTINUED] furosemide (LASIX) 40 MG tablet Take 40 mg by mouth daily.     Allergies:   Patient has no known allergies.   Social  History   Socioeconomic History  . Marital status: Unknown    Spouse name: Not on file  . Number of children: Not on file  . Years of education: Not on file  . Highest education level: Not on file  Occupational History  . Not on file  Social Needs  . Financial resource strain: Not on file  . Food insecurity:    Worry: Not on file    Inability: Not on file  . Transportation needs:    Medical: Not on file    Non-medical: Not on file  Tobacco Use  . Smoking status: Former Smoker    Packs/day: 3.00    Last attempt to quit: 1998    Years since quitting: 21.7  . Smokeless tobacco: Never Used  Substance and Sexual  Activity  . Alcohol use: Not Currently  . Drug use: Not Currently  . Sexual activity: Not on file  Lifestyle  . Physical activity:    Days per week: Not on file    Minutes per session: Not on file  . Stress: Not on file  Relationships  . Social connections:    Talks on phone: Not on file    Gets together: Not on file    Attends religious service: Not on file    Active member of club or organization: Not on file    Attends meetings of clubs or organizations: Not on file    Relationship status: Not on file  Other Topics Concern  . Not on file  Social History Narrative  . Not on file     Family History: The patient's family history includes Asthma in his daughter; Cancer in his mother; Diabetes in his father and sister. ROS:   Please see the history of present illness.    All other systems reviewed and are negative.  EKGs/Labs/Other Studies Reviewed:    The following studies were reviewed today:    Recent Labs: 09/27/2017: BUN 15; Creatinine, Ser 0.87; NT-Pro BNP 198; Potassium 5.3; Sodium 135  Recent Lipid Panel No results found for: CHOL, TRIG, HDL, CHOLHDL, VLDL, LDLCALC, LDLDIRECT  Physical Exam:    VS:  BP 114/78 (BP Location: Right Arm, Patient Position: Sitting, Cuff Size: Normal)   Pulse 70   Ht 6' (1.829 m)   Wt 163 lb 9.6 oz (74.2 kg)   SpO2 94%   BMI 22.19 kg/m     Wt Readings from Last 3 Encounters:  11/09/17 163 lb 9.6 oz (74.2 kg)  09/27/17 166 lb 12.8 oz (75.7 kg)  08/28/17 165 lb 12.8 oz (75.2 kg)     GEN:  Well nourished, well developed in no acute distress HEENT: Normal NECK: No JVD; No carotid bruits LYMPHATICS: No lymphadenopathy CARDIAC: RRR, no murmurs, rubs, gallops RESPIRATORY:  Clear to auscultation without rales, wheezing or rhonchi  ABDOMEN: Soft, non-tender, non-distended MUSCULOSKELETAL:  No edema; No deformity  SKIN: Warm and dry NEUROLOGIC:  Alert and oriented x 3 PSYCHIATRIC:  Normal affect    Signed, Norman Herrlich, MD    11/09/2017 12:34 PM    [facility]

## 2017-11-09 ENCOUNTER — Ambulatory Visit: Payer: Medicare HMO | Admitting: Cardiology

## 2017-11-09 ENCOUNTER — Encounter: Payer: Self-pay | Admitting: Cardiology

## 2017-11-09 VITALS — BP 114/78 | HR 70 | Ht 72.0 in | Wt 163.6 lb

## 2017-11-09 DIAGNOSIS — I420 Dilated cardiomyopathy: Secondary | ICD-10-CM | POA: Diagnosis not present

## 2017-11-09 DIAGNOSIS — Z7901 Long term (current) use of anticoagulants: Secondary | ICD-10-CM

## 2017-11-09 DIAGNOSIS — I480 Paroxysmal atrial fibrillation: Secondary | ICD-10-CM | POA: Diagnosis not present

## 2017-11-09 DIAGNOSIS — I5022 Chronic systolic (congestive) heart failure: Secondary | ICD-10-CM

## 2017-11-09 LAB — BASIC METABOLIC PANEL
BUN / CREAT RATIO: 21 (ref 10–24)
BUN: 17 mg/dL (ref 8–27)
CALCIUM: 9.7 mg/dL (ref 8.6–10.2)
CO2: 26 mmol/L (ref 20–29)
CREATININE: 0.8 mg/dL (ref 0.76–1.27)
Chloride: 94 mmol/L — ABNORMAL LOW (ref 96–106)
GFR calc non Af Amer: 91 mL/min/{1.73_m2} (ref >59)
GFR, EST AFRICAN AMERICAN: 105 mL/min/{1.73_m2} (ref >59)
Glucose: 86 mg/dL (ref 65–99)
Potassium: 4.3 mmol/L (ref 3.5–5.2)
Sodium: 136 mmol/L (ref 134–144)

## 2017-11-09 LAB — PRO B NATRIURETIC PEPTIDE: NT-PRO BNP: 77 pg/mL (ref 0–376)

## 2017-11-09 MED ORDER — FUROSEMIDE 40 MG PO TABS: ORAL_TABLET | ORAL | 5 refills | Status: DC

## 2017-11-09 NOTE — Patient Instructions (Signed)
Medication Instructions:  Your physician has recommended you make the following change in your medication:   REDUCE: furosemide to 40mg  daily on Mon, Wed, and Friday.  You can take furosemide on other days if your weight is 164lbs or greater   Labwork: You will have BMP and ProBNP today  Testing/Procedures: NONE  Follow-Up: Your physician wants you to follow-up in: 6 months.  You will receive a reminder letter in the mail two months in advance. If you don't receive a letter, please call our office to schedule the follow-up appointment.   Any Other Special Instructions Will Be Listed Below (If Applicable).     If you need a refill on your cardiac medications before your next appointment, please call your pharmacy.     Heart Failure  Weigh yourself every morning when you first wake up and record on a calender or note pad, bring this to your office visits. Using a pill tender can help with taking your medications consistently.  Limit your fluid intake to 2 liters daily  Limit your sodium intake to less than 2-3 grams daily. Ask if you need dietary teaching.  If you gain more than 3 pounds (from your dry weight ), double your dose of diuretic for the day.  If you gain more than 5 pounds (from your dry weight), double your dose of lasix and call your heart failure doctor.  Please do not smoke tobacco since it is very bad for your heart.  Please do not drink alcohol since it can worsen your heart failure.Also avoid OTC nonsteroidal drugs, such as advil, aleve and motrin.  Try to exercise for at least 30 minutes every day because this will help your heart be more efficient. You may be eligible for supervised cardiac rehab, ask your physician.

## 2017-11-14 DIAGNOSIS — H40011 Open angle with borderline findings, low risk, right eye: Secondary | ICD-10-CM | POA: Diagnosis not present

## 2018-01-07 ENCOUNTER — Encounter: Payer: Self-pay | Admitting: Cardiology

## 2018-01-07 DIAGNOSIS — R490 Dysphonia: Secondary | ICD-10-CM | POA: Diagnosis not present

## 2018-01-07 DIAGNOSIS — R739 Hyperglycemia, unspecified: Secondary | ICD-10-CM | POA: Diagnosis not present

## 2018-01-07 DIAGNOSIS — E559 Vitamin D deficiency, unspecified: Secondary | ICD-10-CM | POA: Diagnosis not present

## 2018-01-07 DIAGNOSIS — I4891 Unspecified atrial fibrillation: Secondary | ICD-10-CM | POA: Diagnosis not present

## 2018-01-07 DIAGNOSIS — I5030 Unspecified diastolic (congestive) heart failure: Secondary | ICD-10-CM | POA: Diagnosis not present

## 2018-01-07 DIAGNOSIS — Z6822 Body mass index (BMI) 22.0-22.9, adult: Secondary | ICD-10-CM | POA: Diagnosis not present

## 2018-01-07 DIAGNOSIS — Z23 Encounter for immunization: Secondary | ICD-10-CM | POA: Diagnosis not present

## 2018-01-07 DIAGNOSIS — E063 Autoimmune thyroiditis: Secondary | ICD-10-CM | POA: Diagnosis not present

## 2018-01-07 DIAGNOSIS — E538 Deficiency of other specified B group vitamins: Secondary | ICD-10-CM | POA: Diagnosis not present

## 2018-01-22 DIAGNOSIS — M545 Low back pain: Secondary | ICD-10-CM | POA: Diagnosis not present

## 2018-01-22 DIAGNOSIS — Z6822 Body mass index (BMI) 22.0-22.9, adult: Secondary | ICD-10-CM | POA: Diagnosis not present

## 2018-01-22 DIAGNOSIS — K5730 Diverticulosis of large intestine without perforation or abscess without bleeding: Secondary | ICD-10-CM | POA: Diagnosis not present

## 2018-01-22 DIAGNOSIS — R319 Hematuria, unspecified: Secondary | ICD-10-CM | POA: Diagnosis not present

## 2018-01-22 DIAGNOSIS — N390 Urinary tract infection, site not specified: Secondary | ICD-10-CM | POA: Diagnosis not present

## 2018-01-22 DIAGNOSIS — K5790 Diverticulosis of intestine, part unspecified, without perforation or abscess without bleeding: Secondary | ICD-10-CM | POA: Diagnosis not present

## 2018-01-22 DIAGNOSIS — I700 Atherosclerosis of aorta: Secondary | ICD-10-CM | POA: Diagnosis not present

## 2018-01-31 DIAGNOSIS — I5030 Unspecified diastolic (congestive) heart failure: Secondary | ICD-10-CM | POA: Diagnosis not present

## 2018-01-31 DIAGNOSIS — N390 Urinary tract infection, site not specified: Secondary | ICD-10-CM | POA: Diagnosis not present

## 2018-01-31 DIAGNOSIS — E063 Autoimmune thyroiditis: Secondary | ICD-10-CM | POA: Diagnosis not present

## 2018-01-31 DIAGNOSIS — Z6822 Body mass index (BMI) 22.0-22.9, adult: Secondary | ICD-10-CM | POA: Diagnosis not present

## 2018-01-31 DIAGNOSIS — K5790 Diverticulosis of intestine, part unspecified, without perforation or abscess without bleeding: Secondary | ICD-10-CM | POA: Diagnosis not present

## 2018-01-31 DIAGNOSIS — N400 Enlarged prostate without lower urinary tract symptoms: Secondary | ICD-10-CM | POA: Diagnosis not present

## 2018-02-12 DIAGNOSIS — Z87891 Personal history of nicotine dependence: Secondary | ICD-10-CM | POA: Diagnosis not present

## 2018-02-12 DIAGNOSIS — J3489 Other specified disorders of nose and nasal sinuses: Secondary | ICD-10-CM | POA: Diagnosis not present

## 2018-02-12 DIAGNOSIS — R490 Dysphonia: Secondary | ICD-10-CM | POA: Diagnosis not present

## 2018-02-12 DIAGNOSIS — J387 Other diseases of larynx: Secondary | ICD-10-CM | POA: Diagnosis not present

## 2018-02-12 DIAGNOSIS — J342 Deviated nasal septum: Secondary | ICD-10-CM | POA: Diagnosis not present

## 2018-04-11 DIAGNOSIS — N400 Enlarged prostate without lower urinary tract symptoms: Secondary | ICD-10-CM | POA: Diagnosis not present

## 2018-04-11 DIAGNOSIS — D649 Anemia, unspecified: Secondary | ICD-10-CM | POA: Diagnosis not present

## 2018-04-11 DIAGNOSIS — R739 Hyperglycemia, unspecified: Secondary | ICD-10-CM | POA: Diagnosis not present

## 2018-04-11 DIAGNOSIS — E559 Vitamin D deficiency, unspecified: Secondary | ICD-10-CM | POA: Diagnosis not present

## 2018-04-11 DIAGNOSIS — E063 Autoimmune thyroiditis: Secondary | ICD-10-CM | POA: Diagnosis not present

## 2018-04-11 DIAGNOSIS — I4891 Unspecified atrial fibrillation: Secondary | ICD-10-CM | POA: Diagnosis not present

## 2018-04-11 DIAGNOSIS — Z6823 Body mass index (BMI) 23.0-23.9, adult: Secondary | ICD-10-CM | POA: Diagnosis not present

## 2018-04-11 DIAGNOSIS — E538 Deficiency of other specified B group vitamins: Secondary | ICD-10-CM | POA: Diagnosis not present

## 2018-04-11 DIAGNOSIS — I5030 Unspecified diastolic (congestive) heart failure: Secondary | ICD-10-CM | POA: Diagnosis not present

## 2018-04-11 DIAGNOSIS — Z79899 Other long term (current) drug therapy: Secondary | ICD-10-CM | POA: Diagnosis not present

## 2018-04-19 DIAGNOSIS — Z1212 Encounter for screening for malignant neoplasm of rectum: Secondary | ICD-10-CM | POA: Diagnosis not present

## 2018-04-29 DIAGNOSIS — I4891 Unspecified atrial fibrillation: Secondary | ICD-10-CM | POA: Diagnosis not present

## 2018-04-29 DIAGNOSIS — Q649 Congenital malformation of urinary system, unspecified: Secondary | ICD-10-CM | POA: Diagnosis not present

## 2018-04-29 DIAGNOSIS — I5030 Unspecified diastolic (congestive) heart failure: Secondary | ICD-10-CM | POA: Diagnosis not present

## 2018-04-29 DIAGNOSIS — E559 Vitamin D deficiency, unspecified: Secondary | ICD-10-CM | POA: Diagnosis not present

## 2018-04-29 DIAGNOSIS — Z6823 Body mass index (BMI) 23.0-23.9, adult: Secondary | ICD-10-CM | POA: Diagnosis not present

## 2018-04-29 DIAGNOSIS — E538 Deficiency of other specified B group vitamins: Secondary | ICD-10-CM | POA: Diagnosis not present

## 2018-04-29 DIAGNOSIS — N400 Enlarged prostate without lower urinary tract symptoms: Secondary | ICD-10-CM | POA: Diagnosis not present

## 2018-04-29 DIAGNOSIS — E063 Autoimmune thyroiditis: Secondary | ICD-10-CM | POA: Diagnosis not present

## 2018-04-29 DIAGNOSIS — D649 Anemia, unspecified: Secondary | ICD-10-CM | POA: Diagnosis not present

## 2018-05-07 ENCOUNTER — Telehealth: Payer: Self-pay | Admitting: Cardiology

## 2018-05-07 NOTE — Telephone Encounter (Signed)
Cardiac Questionnaire:    Since your last visit or hospitalization:    1. Have you been having new or worsening chest pain? no   2. Have you been having new or worsening shortness of breath? no 3. Have you been having new or worsening leg swelling, wt gain, or increase in abdominal girth (pants fitting more tightly)? no   4. Have you had any passing out spells? no    *A YES to any of these questions would result in the appointment being kept. *If all the answers to these questions are NO, we should indicate that given the current situation regarding the worldwide coronarvirus pandemic, at the recommendation of the CDC, we are looking to limit gatherings in our waiting area, and thus will reschedule their appointment beyond four weeks from today.   _____________   COVID-19 Pre-Screening Questions:   Do you currently have a fever? no  Have you recently travelled on a cruise, internationally, or to [place], [place], [place], [place], [place], or [place], [place]) ? no  Have you been in contact with someone that is currently pending confirmation of Covid19 testing or has been confirmed to have the Covid19 virus?  No  Are you currently experiencing fatigue or cough? No  CONSENT FOR TELE-HEALTH VISIT - PLEASE REVIEW  I hereby voluntarily request, consent and authorize [facility] and its employed or contracted physicians, physician assistants, nurse practitioners or other licensed health care professionals (the Practitioner), to provide me with telemedicine health care services (the Services") as deemed necessary by the treating Practitioner. I acknowledge and consent to receive the Services by the Practitioner via telemedicine. I understand that the telemedicine visit will involve communicating with the Practitioner through live audiovisual communication technology and the disclosure of certain medical information by electronic transmission. I acknowledge that I have been given the opportunity to  request an in-person assessment or other available alternative prior to the telemedicine visit and am voluntarily participating in the telemedicine visit.  I understand that I have the right to withhold or withdraw my consent to the use of telemedicine in the course of my care at any time, without affecting my right to future care or treatment, and that the Practitioner or I may terminate the telemedicine visit at any time. I understand that I have the right to inspect all information obtained and/or recorded in the course of the telemedicine visit and may receive copies of available information for a reasonable fee.  I understand that some of the potential risks of receiving the Services via telemedicine include:  Delay or interruption in medical evaluation due to technological equipment failure or disruption; Information transmitted may not be sufficient (e.g. poor resolution of images) to allow for appropriate medical decision making by the Practitioner; and/or  In rare instances, security protocols could fail, causing a breach of personal health information.  Furthermore, I acknowledge that it is my responsibility to provide information about my medical history, conditions and care that is complete and accurate to the best of my ability. I acknowledge that Practitioner's advice, recommendations, and/or decision may be based on factors not within their control, such as incomplete or inaccurate data provided by me or distortions of diagnostic images or specimens that may result from electronic transmissions. I understand that the practice of medicine is not an exact science and that Practitioner makes no warranties or guarantees regarding treatment outcomes. I acknowledge that I will receive a copy of this consent concurrently upon execution via email to the email  address I last provided but may also request a printed copy by calling the office of [facility].    I understand that my insurance will be  billed for this visit.   I have read or had this consent read to me. I understand the contents of this consent, which adequately explains the benefits and risks of the Services being provided via telemedicine.  I have been provided ample opportunity to ask questions regarding this consent and the Services and have had my questions answered to my satisfaction. I give my informed consent for the services to be provided through the use of telemedicine in my medical care   By participating in this telemedicine visit I agree to the above..patient agrees to the televisit and gives verbal consent.

## 2018-05-09 ENCOUNTER — Encounter: Payer: Self-pay | Admitting: Cardiology

## 2018-05-09 ENCOUNTER — Telehealth (INDEPENDENT_AMBULATORY_CARE_PROVIDER_SITE_OTHER): Payer: Medicare HMO | Admitting: Cardiology

## 2018-05-09 VITALS — BP 109/83 | HR 79 | Ht 72.0 in | Wt 160.8 lb

## 2018-05-09 DIAGNOSIS — I2510 Atherosclerotic heart disease of native coronary artery without angina pectoris: Secondary | ICD-10-CM

## 2018-05-09 DIAGNOSIS — I420 Dilated cardiomyopathy: Secondary | ICD-10-CM

## 2018-05-09 DIAGNOSIS — Z79899 Other long term (current) drug therapy: Secondary | ICD-10-CM | POA: Diagnosis not present

## 2018-05-09 DIAGNOSIS — I5042 Chronic combined systolic (congestive) and diastolic (congestive) heart failure: Secondary | ICD-10-CM

## 2018-05-09 DIAGNOSIS — I480 Paroxysmal atrial fibrillation: Secondary | ICD-10-CM

## 2018-05-09 DIAGNOSIS — Z7901 Long term (current) use of anticoagulants: Secondary | ICD-10-CM | POA: Diagnosis not present

## 2018-05-09 NOTE — Progress Notes (Signed)
Virtual Visit via Telephone Note    Evaluation Performed:  Follow-up visit  This visit type was conducted due to national recommendations for restrictions regarding the COVID-19 Pandemic (e.g. social distancing).  This format is felt to be most appropriate for this patient at this time.  All issues noted in this document were discussed and addressed.  No physical exam was performed (except for noted visual exam findings with Video Visits).  Please refer to the patient's chart (MyChart message for video visits and phone note for telephone visits) for the patient's consent to telehealth for [facility].  Date:  05/09/2018   ID:  Jonathan Patterson, DOB 01-May-1945, MRN [id number]  Patient Location:  Home  Provider location:   Jonathan Patterson  PCP:  Jonathan Maple., MD  Cardiologist:  No primary care provider on file. Jonathan Patterson Electrophysiologist:  None   Chief Complaint:  FU for PAF and cardiomyopathy he was last seen  History of Present Illness:    Jonathan Patterson is a 73 y.o. male who presents via audio/video conferencing for a telehealth visit today.  He was last seen 11/09/2017 at which time he had spontaneously converted and remained in sinus rhythm without an antiarrhythmic drug.  He appeared to have recovered his proBNP level was 77.  A subsequent echocardiogram showed normalization of left ventricular size and function.  The patient does not have symptoms concerning for COVID-19 infection (fever, chills, cough, or new shortness of breath).  He has had a [facility] admission 08/15/2017 he was in atrial fibrillation with a rapid ventricular response decompensated heart failure acute kidney injury acute liver injury and with control of his heart rate and treatment of heart failure he improved in hospital.  He remained in atrial fibrillation rate controlled his initial BNP level 9960 prior to discharge 1630 liver function improved the transaminases were still elevated at the time of  discharge from the hospital he was anticoagulated with the plan to come back and see me in the office in anticipation of outpatient cardioversion to sinus rhythm.  While hospitalized an echocardiogram was performed ejection fraction was less than 20% .    Follow-up cardiac CTA performed 09/25/2017 shows a very low calcium score 5 and mild nonobstructive CAD.  He thinks he is made a full recovery he is returned to work in [employer] but presently is out of work with the coronavirus epidemic.  He practices social isolation and distance handwashing and has had no fever cough shortness of breath or sputum.  He is no longer aware of his heart and has had no edema orthopnea shortness of breath palpitations syncope or TIA.  He has had no bleeding complication of his anticoagulant.  He restrict sodium checks blood pressure at home and is compliant with medications.  His daughter had been a great help to him in the past no longer is coming to the home with the social isolation.   Prior CV studies:   The following studies were reviewed today:  Patient:    Jonathan Patterson MR #:       [id number] Study Date: 10/22/2017 Gender:     M Age:        72 Height:     182.9 cm Weight:     75.7 kg BSA:        1.96 m^2 Pt. Status: Room:    Arna Medici  ATTENDING    Norman Herrlich, MD  [facility]  Norman Herrlich, MD  REFERRING    Norman Herrlich, MD  SONOGRAPHER  Lanae Crumbly, RDCS  PERFORMING   [facility],    cc:   ------------------------------------------------------------------- LV EF: 55% -   60%   ------------------------------------------------------------------- Indications:      CHF - 428.0.  Cardiomyopathy - dilated 425.4.   ------------------------------------------------------------------- History:   PMH:   Atrial fibrillation.  Coronary artery disease.   ------------------------------------------------------------------- Study Conclusions   - Left ventricle: The cavity size was  normal. Wall thickness was   normal. Systolic function was normal. The estimated ejection   fraction was in the range of 55% to 60%.    Past Medical History:  Diagnosis Date  . Acute kidney injury (HCC)   . Arrhythmia   . Atrial fibrillation with RVR (HCC)   . Cardiomyopathy (HCC)   . Congestive heart failure (CHF) (HCC)   . Diverticulitis   . Elevated liver enzymes   . Hypotension   . Hypothyroid   . Iron deficiency anemia   . Mitral regurgitation    Past Surgical History:  Procedure Laterality Date  . CATARACT EXTRACTION Bilateral      Current Meds  Medication Sig  . Calcium Carb-Cholecalciferol (CALCIUM + D3 PO) Take 1 tablet by mouth daily.  . Cyanocobalamin (VITAMIN B12) 1000 MCG TBCR Take 1 tablet by mouth daily.  Marland Kitchen ELIQUIS 5 MG TABS tablet Take 5 mg by mouth 2 (two) times daily.  . furosemide (LASIX) 40 MG tablet Take 1 tablet on Mon, Wed, and Friday.  Take 1 tablet other days if weight is 164 lbs or greater  . levothyroxine (SYNTHROID, LEVOTHROID) 100 MCG tablet Take 100 mcg by mouth daily.  Marland Kitchen lisinopril (PRINIVIL,ZESTRIL) 2.5 MG tablet Take 2.5 mg by mouth daily.  . metoprolol tartrate (LOPRESSOR) 25 MG tablet Take 25 mg by mouth 2 (two) times daily.     Allergies:   Patient has no known allergies.   Social History   Tobacco Use  . Smoking status: Former Smoker    Packs/day: 3.00    Types: Cigarettes    Last attempt to quit: 1998    Years since quitting: 22.2  . Smokeless tobacco: Never Used  Substance Use Topics  . Alcohol use: Not Currently  . Drug use: Not Currently     Family Hx: The patient's family history includes Asthma in his daughter; Cancer in his mother; Diabetes in his father and sister.  ROS:   Please see the history of present illness.    Labs performed 04/29/2018 show a hemoglobin of 13.2 potassium 4.7 creatinine 1.04 GFR 72 cc/min and TSH normal All other systems reviewed and are negative.   Labs/Other Tests and Data Reviewed:     Recent Labs: 11/09/2017: BUN 17; Creatinine, Ser 0.80; NT-Pro BNP 77; Potassium 4.3; Sodium 136   Recent Lipid Panel No results found for: CHOL, TRIG, HDL, CHOLHDL, LDLCALC, LDLDIRECT  Wt Readings from Last 3 Encounters:  05/09/18 160 lb 12.8 oz (72.9 kg)  11/09/17 163 lb 9.6 oz (74.2 kg)  09/27/17 166 lb 12.8 oz (75.7 kg)     Objective:    Vital Signs:  BP 109/83 (BP Location: Right Arm, Patient Position: Sitting)   Pulse 79   Ht 6' (1.829 m)   Wt 160 lb 12.8 oz (72.9 kg)   BMI 21.81 kg/m     He is alert and comfortable without respiratory distress during the phone conversation  ASSESSMENT & PLAN:    1.  Paroxysmal atrial fibrillation, stable  asymptomatic continue beta-blocker and current anticoagulant.  I do not think he requires an ambulatory event monitor on antiarrhythmic drug. 2.  Chronic anticoagulation stable well-tolerated continue his current anticoagulant 3.  Dilated cardiomyopathy recovered continue guideline directed therapy with beta-blocker ACE inhibitor and although he could have had cardiomyopathy due to atrial fibrillation clinically I think that cardiomyopathy caused his atrial fibrillation and in retrospect may have had myocarditis non-fulminant at the time of his initial presentation.  He is recovered. 4.  Heart failure chronic combined stable compensated asymptomatic New [place] Heart Association class I in view of his previous severe cardiomyopathy I would not withdraw beta-blocker and ACE inhibitor. 5.  Mild CAD stable I will address lipid-lowering therapy at this next visit.  COVID-19 Education: The signs and symptoms of COVID-19 were discussed with the patient and how to seek care for testing (follow up with PCP or arrange E-visit).  The importance of social distancing was discussed today.  Patient Risk:   After full review of this patient's clinical status, I feel that they are at least moderate risk at this time.  Time:   Today, I have spent 24 minutes  with the patient with telehealth technology discussing the results of his echocardiogram course of his heart disease the need for ongoing medical therapy despite the fact that the man feels fully recovered the need for compliance with home self-management blood pressure weights sodium restriction and the need for continued anticoagulation despite the fact that his atrial fibrillation has not clinically recurred.  He had multiple questions we discussed these issues and he seemed confident and assured at the end of the visit and will maintain his current treatment..     Medication Adjustments/Labs and Tests Ordered: Current medicines are reviewed at length with the patient today.  Concerns regarding medicines are outlined above.  Tests Ordered: No orders of the defined types were placed in this encounter.  Medication Changes: No orders of the defined types were placed in this encounter.   Disposition:  Follow up in 6 month(s)  Signed, Norman Herrlich, MD  05/09/2018 3:00 PM    [facility]

## 2018-05-09 NOTE — Patient Instructions (Addendum)
Medication Instructions:  Your physician recommends that you continue on your current medications as directed. Please refer to the Current Medication list given to you today.  If you need a refill on your cardiac medications before your next appointment, please call your pharmacy.   Lab work: None  If you have labs (blood work) drawn today and your tests are completely normal, you will receive your results only by: Marland Kitchen MyChart Message (if you have MyChart) OR . A paper copy in the mail If you have any lab test that is abnormal or we need to change your treatment, we will call you to review the results.  Testing/Procedures: None  Follow-Up: At [facility], you and your health needs are our priority.  As part of our continuing mission to provide you with exceptional heart care, we have created designated Provider Care Teams.  These Care Teams include your primary Cardiologist (physician) and Advanced Practice Providers (APPs -  Physician Assistants and Nurse Practitioners) who all work together to provide you with the care you need, when you need it. You will need a follow up appointment in 6 months: Friday, 11/15/2018 at 4:00 pm in the [place] office.

## 2018-07-15 DIAGNOSIS — I4891 Unspecified atrial fibrillation: Secondary | ICD-10-CM | POA: Diagnosis not present

## 2018-07-15 DIAGNOSIS — Z9181 History of falling: Secondary | ICD-10-CM | POA: Diagnosis not present

## 2018-07-15 DIAGNOSIS — E538 Deficiency of other specified B group vitamins: Secondary | ICD-10-CM | POA: Diagnosis not present

## 2018-07-15 DIAGNOSIS — I5030 Unspecified diastolic (congestive) heart failure: Secondary | ICD-10-CM | POA: Diagnosis not present

## 2018-07-15 DIAGNOSIS — N400 Enlarged prostate without lower urinary tract symptoms: Secondary | ICD-10-CM | POA: Diagnosis not present

## 2018-07-15 DIAGNOSIS — E063 Autoimmune thyroiditis: Secondary | ICD-10-CM | POA: Diagnosis not present

## 2018-07-15 DIAGNOSIS — Z1331 Encounter for screening for depression: Secondary | ICD-10-CM | POA: Diagnosis not present

## 2018-07-15 DIAGNOSIS — E559 Vitamin D deficiency, unspecified: Secondary | ICD-10-CM | POA: Diagnosis not present

## 2018-07-31 DIAGNOSIS — H40013 Open angle with borderline findings, low risk, bilateral: Secondary | ICD-10-CM | POA: Diagnosis not present

## 2018-07-31 DIAGNOSIS — H5201 Hypermetropia, right eye: Secondary | ICD-10-CM | POA: Diagnosis not present

## 2018-07-31 DIAGNOSIS — H26492 Other secondary cataract, left eye: Secondary | ICD-10-CM | POA: Diagnosis not present

## 2018-07-31 DIAGNOSIS — H35372 Puckering of macula, left eye: Secondary | ICD-10-CM | POA: Diagnosis not present

## 2018-07-31 DIAGNOSIS — H5212 Myopia, left eye: Secondary | ICD-10-CM | POA: Diagnosis not present

## 2018-08-23 DIAGNOSIS — N400 Enlarged prostate without lower urinary tract symptoms: Secondary | ICD-10-CM | POA: Diagnosis not present

## 2018-08-23 DIAGNOSIS — E538 Deficiency of other specified B group vitamins: Secondary | ICD-10-CM | POA: Diagnosis not present

## 2018-08-23 DIAGNOSIS — I5030 Unspecified diastolic (congestive) heart failure: Secondary | ICD-10-CM | POA: Diagnosis not present

## 2018-08-23 DIAGNOSIS — Z6823 Body mass index (BMI) 23.0-23.9, adult: Secondary | ICD-10-CM | POA: Diagnosis not present

## 2018-08-23 DIAGNOSIS — E063 Autoimmune thyroiditis: Secondary | ICD-10-CM | POA: Diagnosis not present

## 2018-08-23 DIAGNOSIS — R739 Hyperglycemia, unspecified: Secondary | ICD-10-CM | POA: Diagnosis not present

## 2018-08-23 DIAGNOSIS — I4891 Unspecified atrial fibrillation: Secondary | ICD-10-CM | POA: Diagnosis not present

## 2018-08-23 DIAGNOSIS — E559 Vitamin D deficiency, unspecified: Secondary | ICD-10-CM | POA: Diagnosis not present

## 2018-08-23 DIAGNOSIS — M25562 Pain in left knee: Secondary | ICD-10-CM | POA: Diagnosis not present

## 2018-11-15 ENCOUNTER — Ambulatory Visit: Payer: Medicare HMO | Admitting: Cardiology

## 2018-11-25 DIAGNOSIS — I4891 Unspecified atrial fibrillation: Secondary | ICD-10-CM | POA: Diagnosis not present

## 2018-11-25 DIAGNOSIS — N400 Enlarged prostate without lower urinary tract symptoms: Secondary | ICD-10-CM | POA: Diagnosis not present

## 2018-11-25 DIAGNOSIS — Z23 Encounter for immunization: Secondary | ICD-10-CM | POA: Diagnosis not present

## 2018-11-25 DIAGNOSIS — D649 Anemia, unspecified: Secondary | ICD-10-CM | POA: Diagnosis not present

## 2018-11-25 DIAGNOSIS — Z79899 Other long term (current) drug therapy: Secondary | ICD-10-CM | POA: Diagnosis not present

## 2018-11-25 DIAGNOSIS — I5030 Unspecified diastolic (congestive) heart failure: Secondary | ICD-10-CM | POA: Diagnosis not present

## 2018-11-25 DIAGNOSIS — E538 Deficiency of other specified B group vitamins: Secondary | ICD-10-CM | POA: Diagnosis not present

## 2018-11-25 DIAGNOSIS — E063 Autoimmune thyroiditis: Secondary | ICD-10-CM | POA: Diagnosis not present

## 2018-11-25 DIAGNOSIS — E559 Vitamin D deficiency, unspecified: Secondary | ICD-10-CM | POA: Diagnosis not present

## 2018-11-25 DIAGNOSIS — R739 Hyperglycemia, unspecified: Secondary | ICD-10-CM | POA: Diagnosis not present

## 2018-12-01 NOTE — Progress Notes (Signed)
Cardiology Office Note:    Date:  12/04/2018   ID:  Jonathan Patterson, DOB 01-08-1946, MRN [id number]  PCP:  Guadalupe Maple., MD  Cardiologist:  Norman Herrlich, MD    Referring MD: Guadalupe Maple., MD    ASSESSMENT:    1. PAF (paroxysmal atrial fibrillation) (HCC)   2. Chronic anticoagulation   3. Dilated cardiomyopathy (HCC)   4. Mild CAD    PLAN:    In order of problems listed above:  1. Stable maintaining sinus rhythm without an antiarrhythmic drug he will remain anticoagulated and continue beta-blocker.  I encouraged him to continue to check heart rate and blood pressure at home contact me if he has abnormal rhythm or tachycardia. 2. Continue his anticoagulant moderate moderate stroke risk 3. Improved EF is normalized heart failure has resolved now continue guideline directed therapy beta-blocker ACE inhibitor 4. Stable CAD no angina beta-blocker   Next appointment: 6 months   Medication Adjustments/Labs and Tests Ordered: Current medicines are reviewed at length with the patient today.  Concerns regarding medicines are outlined above.  No orders of the defined types were placed in this encounter.  No orders of the defined types were placed in this encounter.   Chief Complaint  Patient presents with  . Follow-up  . Atrial Fibrillation    History of Present Illness:    Jonathan Patterson is a 73 y.o. male with a hx of mild CAD, PAF and cardiomyopathy with heart failure from rapid AF.  Subsequent echocardiogram 10/22/2017 showed marked improvement that his previous severe left ventricular dysfunction had normalized with an ejection fraction of 55 to 60% with normal left ventricular size.  He was last seen 05/10/2018. Compliance with diet, lifestyle and medications: Yes  Less has made a more remarkable recovery vigorous active and has had no exercise intolerance edema shortness of breath orthopnea chest pain or syncope.  He had recent labs at his PCP office.  He tolerates his  anticoagulant without bleeding complication he has heart rate and blood pressure checked at home without irregularity or tachycardia and weighs daily and has not needed a diuretic. Past Medical History:  Diagnosis Date  . Acute kidney injury (HCC)   . Arrhythmia   . Atrial fibrillation with RVR (HCC)   . Cardiomyopathy (HCC)   . Congestive heart failure (CHF) (HCC)   . Diverticulitis   . Elevated liver enzymes   . Hypotension   . Hypothyroid   . Iron deficiency anemia   . Mitral regurgitation     Past Surgical History:  Procedure Laterality Date  . CATARACT EXTRACTION Bilateral     Current Medications: Current Meds  Medication Sig  . Calcium Carb-Cholecalciferol (CALCIUM + D3 PO) Take 1 tablet by mouth daily.  . Cyanocobalamin (VITAMIN B12) 1000 MCG TBCR Take 1 tablet by mouth daily.  Marland Kitchen ELIQUIS 5 MG TABS tablet Take 5 mg by mouth 2 (two) times daily.  . ferrous sulfate 324 MG TBEC Take 324 mg by mouth daily with breakfast.  . furosemide (LASIX) 40 MG tablet Take 1 tablet on Mon, Wed, and Friday.  Take 1 tablet other days if weight is 164 lbs or greater  . levothyroxine (SYNTHROID) 112 MCG tablet Take 112 mcg by mouth daily.   Marland Kitchen lisinopril (PRINIVIL,ZESTRIL) 2.5 MG tablet Take 2.5 mg by mouth daily.  . metoprolol tartrate (LOPRESSOR) 25 MG tablet Take 25 mg by mouth 2 (two) times daily.     Allergies:   Patient has no  known allergies.   Social History   Socioeconomic History  . Marital status: Unknown    Spouse name: Not on file  . Number of children: Not on file  . Years of education: Not on file  . Highest education level: Not on file  Occupational History  . Not on file  Social Needs  . Financial resource strain: Not on file  . Food insecurity    Worry: Not on file    Inability: Not on file  . Transportation needs    Medical: Not on file    Non-medical: Not on file  Tobacco Use  . Smoking status: Former Smoker    Packs/day: 3.00    Types: Cigarettes    Quit  date: 1998    Years since quitting: 22.8  . Smokeless tobacco: Never Used  Substance and Sexual Activity  . Alcohol use: Not Currently  . Drug use: Not Currently  . Sexual activity: Not on file  Lifestyle  . Physical activity    Days per week: Not on file    Minutes per session: Not on file  . Stress: Not on file  Relationships  . Social Herbalist on phone: Not on file    Gets together: Not on file    Attends religious service: Not on file    Active member of club or organization: Not on file    Attends meetings of clubs or organizations: Not on file    Relationship status: Not on file  Other Topics Concern  . Not on file  Social History Narrative  . Not on file     Family History: The patient's family history includes Asthma in his daughter; Cancer in his mother; Diabetes in his father and sister. ROS:   Please see the history of present illness.    All other systems reviewed and are negative.  EKGs/Labs/Other Studies Reviewed:    The following studies were reviewed today:  EKG:  EKG ordered today and personally reviewed.  The ekg ordered today demonstrates sinus rhythm 2 PVCs otherwise normal  Recent Labs: Performed at his PCP office 11/25/2018 A1c 5.6% creatinine 0.71 TSH normal  Physical Exam:    VS:  BP 106/64   Pulse 72   Wt 169 lb 12.8 oz (77 kg)   SpO2 94%   BMI 23.03 kg/m     Wt Readings from Last 3 Encounters:  12/04/18 169 lb 12.8 oz (77 kg)  05/09/18 160 lb 12.8 oz (72.9 kg)  11/09/17 163 lb 9.6 oz (74.2 kg)     GEN:  Well nourished, well developed in no acute distress HEENT: Normal NECK: No JVD; No carotid bruits LYMPHATICS: No lymphadenopathy CARDIAC: RRR, no murmurs, rubs, gallops RESPIRATORY:  Clear to auscultation without rales, wheezing or rhonchi  ABDOMEN: Soft, non-tender, non-distended MUSCULOSKELETAL:  No edema; No deformity  SKIN: Warm and dry NEUROLOGIC:  Alert and oriented x 3 PSYCHIATRIC:  Normal affect     Signed, Shirlee More, MD  12/04/2018 11:20 AM    [place]

## 2018-12-04 ENCOUNTER — Other Ambulatory Visit: Payer: Self-pay

## 2018-12-04 ENCOUNTER — Ambulatory Visit (INDEPENDENT_AMBULATORY_CARE_PROVIDER_SITE_OTHER): Payer: Medicare HMO | Admitting: Cardiology

## 2018-12-04 ENCOUNTER — Encounter: Payer: Self-pay | Admitting: Cardiology

## 2018-12-04 VITALS — BP 106/64 | HR 72 | Wt 169.8 lb

## 2018-12-04 DIAGNOSIS — I420 Dilated cardiomyopathy: Secondary | ICD-10-CM

## 2018-12-04 DIAGNOSIS — I480 Paroxysmal atrial fibrillation: Secondary | ICD-10-CM | POA: Diagnosis not present

## 2018-12-04 DIAGNOSIS — Z7901 Long term (current) use of anticoagulants: Secondary | ICD-10-CM

## 2018-12-04 DIAGNOSIS — I2510 Atherosclerotic heart disease of native coronary artery without angina pectoris: Secondary | ICD-10-CM | POA: Diagnosis not present

## 2018-12-04 DIAGNOSIS — I509 Heart failure, unspecified: Secondary | ICD-10-CM | POA: Diagnosis not present

## 2018-12-04 NOTE — Patient Instructions (Signed)
Medication Instructions:  Your physician recommends that you continue on your current medications as directed. Please refer to the Current Medication list given to you today.  *If you need a refill on your cardiac medications before your next appointment, please call your pharmacy*  Lab Work: None  If you have labs (blood work) drawn today and your tests are completely normal, you will receive your results only by: . MyChart Message (if you have MyChart) OR . A paper copy in the mail If you have any lab test that is abnormal or we need to change your treatment, we will call you to review the results.  Testing/Procedures: You had an EKG today.   Follow-Up: At [facility], you and your health needs are our priority.  As part of our continuing mission to provide you with exceptional heart care, we have created designated Provider Care Teams.  These Care Teams include your primary Cardiologist (physician) and Advanced Practice Providers (APPs -  Physician Assistants and Nurse Practitioners) who all work together to provide you with the care you need, when you need it.  Your next appointment:   6 month(s)  The format for your next appointment:   In Person  Provider:   Brian Munley, MD   

## 2018-12-04 NOTE — Addendum Note (Signed)
Addended by: Austin Miles on: 12/04/2018 11:26 AM   Modules accepted: Orders

## 2018-12-10 DIAGNOSIS — Z23 Encounter for immunization: Secondary | ICD-10-CM | POA: Diagnosis not present

## 2019-03-07 DIAGNOSIS — E559 Vitamin D deficiency, unspecified: Secondary | ICD-10-CM | POA: Diagnosis not present

## 2019-03-07 DIAGNOSIS — N400 Enlarged prostate without lower urinary tract symptoms: Secondary | ICD-10-CM | POA: Diagnosis not present

## 2019-03-07 DIAGNOSIS — Z125 Encounter for screening for malignant neoplasm of prostate: Secondary | ICD-10-CM | POA: Diagnosis not present

## 2019-03-07 DIAGNOSIS — R739 Hyperglycemia, unspecified: Secondary | ICD-10-CM | POA: Diagnosis not present

## 2019-03-07 DIAGNOSIS — I4891 Unspecified atrial fibrillation: Secondary | ICD-10-CM | POA: Diagnosis not present

## 2019-03-07 DIAGNOSIS — E063 Autoimmune thyroiditis: Secondary | ICD-10-CM | POA: Diagnosis not present

## 2019-03-07 DIAGNOSIS — E538 Deficiency of other specified B group vitamins: Secondary | ICD-10-CM | POA: Diagnosis not present

## 2019-03-07 DIAGNOSIS — D649 Anemia, unspecified: Secondary | ICD-10-CM | POA: Diagnosis not present

## 2019-03-07 DIAGNOSIS — I5030 Unspecified diastolic (congestive) heart failure: Secondary | ICD-10-CM | POA: Diagnosis not present

## 2019-03-24 DIAGNOSIS — Z6824 Body mass index (BMI) 24.0-24.9, adult: Secondary | ICD-10-CM | POA: Diagnosis not present

## 2019-03-24 DIAGNOSIS — R972 Elevated prostate specific antigen [PSA]: Secondary | ICD-10-CM | POA: Diagnosis not present

## 2019-03-24 DIAGNOSIS — Z139 Encounter for screening, unspecified: Secondary | ICD-10-CM | POA: Diagnosis not present

## 2019-04-09 DIAGNOSIS — R339 Retention of urine, unspecified: Secondary | ICD-10-CM | POA: Diagnosis not present

## 2019-04-09 DIAGNOSIS — N401 Enlarged prostate with lower urinary tract symptoms: Secondary | ICD-10-CM | POA: Diagnosis not present

## 2019-04-09 DIAGNOSIS — R972 Elevated prostate specific antigen [PSA]: Secondary | ICD-10-CM | POA: Diagnosis not present

## 2019-04-16 ENCOUNTER — Encounter (INDEPENDENT_AMBULATORY_CARE_PROVIDER_SITE_OTHER): Payer: Self-pay

## 2019-04-16 ENCOUNTER — Ambulatory Visit: Payer: Medicare HMO | Admitting: Cardiology

## 2019-04-16 ENCOUNTER — Other Ambulatory Visit: Payer: Self-pay

## 2019-04-16 ENCOUNTER — Encounter: Payer: Self-pay | Admitting: Cardiology

## 2019-04-16 VITALS — BP 110/76 | HR 77 | Ht 72.0 in | Wt 171.0 lb

## 2019-04-16 DIAGNOSIS — I480 Paroxysmal atrial fibrillation: Secondary | ICD-10-CM | POA: Diagnosis not present

## 2019-04-16 DIAGNOSIS — I420 Dilated cardiomyopathy: Secondary | ICD-10-CM

## 2019-04-16 DIAGNOSIS — I2510 Atherosclerotic heart disease of native coronary artery without angina pectoris: Secondary | ICD-10-CM | POA: Diagnosis not present

## 2019-04-16 DIAGNOSIS — Z01810 Encounter for preprocedural cardiovascular examination: Secondary | ICD-10-CM | POA: Diagnosis not present

## 2019-04-16 DIAGNOSIS — Z7901 Long term (current) use of anticoagulants: Secondary | ICD-10-CM | POA: Diagnosis not present

## 2019-04-16 NOTE — Patient Instructions (Signed)

## 2019-04-16 NOTE — Progress Notes (Signed)
Cardiology Office Note:    Date:  04/16/2019   ID:  Jonathan Patterson, DOB 1945-12-27, MRN [id number]  PCP:  Ocie Doyne., MD (Inactive)  Cardiologist:  No primary care provider on file.  Electrophysiologist:  None   Referring MD: No ref. provider found   Chief Complaint  Patient presents with  . Pre-op Exam    History of Present Illness:    Jonathan Patterson is a 74 y.o. male with a hx of mild CAD, paroxysmal atrial fibrillation (on Eliquis and Lopressor), history of tachycardia induced cardiomyopathy with most recent improved EF 55 to 60%.  Patient was last seen by Dr. Bettina Gavia in November 26, 2018 at which time he was doing well and in sinus rhythm on antiarrhythmics and remain anticoagulated with rate controlled with Lopressor.  Here for follow-up visit as requested by his urologist for preoperative prostate biopsy.  He offers no complaints he denies any chest pain, shortness of breath, nausea, vomiting.  Past Medical History:  Diagnosis Date  . Acute kidney injury ([place])   . Arrhythmia   . Atrial fibrillation with RVR ([place])   . Cardiomyopathy ([place])   . Congestive heart failure (CHF) ([place])   . Diverticulitis   . Elevated liver enzymes   . Hypotension   . Hypothyroid   . Iron deficiency anemia   . Mitral regurgitation     Past Surgical History:  Procedure Laterality Date  . CATARACT EXTRACTION Bilateral     Current Medications: Current Meds  Medication Sig  . Calcium Carb-Cholecalciferol (CALCIUM + D3 PO) Take 1 tablet by mouth daily.  . Cyanocobalamin (VITAMIN B12) 1000 MCG TBCR Take 1 tablet by mouth daily.  Marland Kitchen ELIQUIS 5 MG TABS tablet Take 5 mg by mouth 2 (two) times daily.  . ferrous sulfate 324 MG TBEC Take 324 mg by mouth daily with breakfast.  . furosemide (LASIX) 40 MG tablet Take 1 tablet on Mon, Wed, and Friday.  Take 1 tablet other days if weight is 164 lbs or greater  . levothyroxine (SYNTHROID) 125 MCG tablet 125 mcg daily.  Marland Kitchen lisinopril (PRINIVIL,ZESTRIL) 2.5  MG tablet Take 2.5 mg by mouth daily.  . metoprolol tartrate (LOPRESSOR) 25 MG tablet Take 25 mg by mouth 2 (two) times daily.  . tamsulosin (FLOMAX) 0.4 MG CAPS capsule Take 1 capsule by mouth daily.     Allergies:   Patient has no known allergies.   Social History   Socioeconomic History  . Marital status: Unknown    Spouse name: Not on file  . Number of children: Not on file  . Years of education: Not on file  . Highest education level: Not on file  Occupational History  . Not on file  Tobacco Use  . Smoking status: Former Smoker    Packs/day: 3.00    Types: Cigarettes    Quit date: 1998    Years since quitting: 23.2  . Smokeless tobacco: Never Used  Substance and Sexual Activity  . Alcohol use: Not Currently  . Drug use: Not Currently  . Sexual activity: Not on file  Other Topics Concern  . Not on file  Social History Narrative  . Not on file   Social Determinants of Health   Financial Resource Strain:   . Difficulty of Paying Living Expenses: Not on file  Food Insecurity:   . Worried About Charity fundraiser in the Last Year: Not on file  . Ran Out of Food in the Last Year: Not  on file  Transportation Needs:   . Lack of Transportation (Medical): Not on file  . Lack of Transportation (Non-Medical): Not on file  Physical Activity:   . Days of Exercise per Week: Not on file  . Minutes of Exercise per Session: Not on file  Stress:   . Feeling of Stress : Not on file  Social Connections:   . Frequency of Communication with Friends and Family: Not on file  . Frequency of Social Gatherings with Friends and Family: Not on file  . Attends Religious Services: Not on file  . Active Member of Clubs or Organizations: Not on file  . Attends Banker Meetings: Not on file  . Marital Status: Not on file     Family History: The patient's family history includes Asthma in his daughter; Cancer in his mother; Diabetes in his father and sister.  ROS:   Review  of Systems  Constitution: Negative for decreased appetite, fever and weight gain.  HENT: Negative for congestion, ear discharge, hoarse voice and sore throat.   Eyes: Negative for discharge, redness, vision loss in right eye and visual halos.  Cardiovascular: Negative for chest pain, dyspnea on exertion, leg swelling, orthopnea and palpitations.  Respiratory: Negative for cough, hemoptysis, shortness of breath and snoring.   Endocrine: Negative for heat intolerance and polyphagia.  Hematologic/Lymphatic: Negative for bleeding problem. Does not bruise/bleed easily.  Skin: Negative for flushing, nail changes, rash and suspicious lesions.  Musculoskeletal: Negative for arthritis, joint pain, muscle cramps, myalgias, neck pain and stiffness.  Gastrointestinal: Negative for abdominal pain, bowel incontinence, diarrhea and excessive appetite.  Genitourinary: Negative for decreased libido, genital sores and incomplete emptying.  Neurological: Negative for brief paralysis, focal weakness, headaches and loss of balance.  Psychiatric/Behavioral: Negative for altered mental status, depression and suicidal ideas.  Allergic/Immunologic: Negative for HIV exposure and persistent infections.    EKGs/Labs/Other Studies Reviewed:    The following studies were reviewed today:   EKG: None today  Echocardiogram October 22, 2017 : Left ventricle: The cavity size was normal. Wall thickness was normal. Systolic function was normal. The estimated ejection  fraction was in the range of 55% to 60%. Wall motion was normal; there were no regional wall motion abnormalities. Doppler parameters are consistent with abnormal left ventricular relaxation (grade 1 diastolic dysfunction).   Recent Labs: No results found for requested labs within last 8760 hours.  Recent Lipid Panel No results found for: CHOL, TRIG, HDL, CHOLHDL, VLDL, LDLCALC, LDLDIRECT  Physical Exam:    VS:  BP 110/76 (BP Location: Right Arm,  Patient Position: Sitting, Cuff Size: Normal)   Pulse 77   Ht 6' (1.829 m)   Wt 171 lb (77.6 kg)   SpO2 98%   BMI 23.19 kg/m     Wt Readings from Last 3 Encounters:  04/16/19 171 lb (77.6 kg)  12/04/18 169 lb 12.8 oz (77 kg)  05/09/18 160 lb 12.8 oz (72.9 kg)     GEN: Well nourished, well developed in no acute distress HEENT: Normal NECK: No JVD; No carotid bruits LYMPHATICS: No lymphadenopathy CARDIAC: S1S2 noted,RRR, no murmurs, rubs, gallops RESPIRATORY:  Clear to auscultation without rales, wheezing or rhonchi  ABDOMEN: Soft, non-tender, non-distended, +bowel sounds, no guarding. EXTREMITIES: No edema, No cyanosis, no clubbing MUSCULOSKELETAL:  No deformity  SKIN: Warm and dry NEUROLOGIC:  Alert and oriented x 3, non-focal PSYCHIATRIC:  Normal affect, good insight  ASSESSMENT:    1. PAF (paroxysmal atrial fibrillation) (HCC)   2.  Mild CAD   3. Preop cardiovascular exam   4. Chronic anticoagulation   5. Dilated cardiomyopathy (HCC)    PLAN:    Is clinically stable from a cardiovascular standpoint.  Continue patient on lopressor milligrams twice a day.   In terms of his  Eliquis 5 mg  twice a day in planning for his biopsy : if no general anesthesia it is  okay for the patient to hold his Eliquis for 4 doses. If general anesthesia he can hold his for 6 doses.  I explained this to the patient and he understands- all of his questions were answered.   Coronary artery disease - having no angina symptoms.  The patient is in agreement with the above plan. The patient left the office in stable condition.  The patient will follow up in office sooner if needed with Dr. Dulce Sellar.   Medication Adjustments/Labs and Tests Ordered: Current medicines are reviewed at length with the patient today.  Concerns regarding medicines are outlined above.  No orders of the defined types were placed in this encounter.  No orders of the defined types were placed in this  encounter.   Patient Instructions  Medication Instructions:  Your physician recommends that you continue on your current medications as directed. Please refer to the Current Medication list given to you today.  *If you need a refill on your cardiac medications before your next appointment, please call your pharmacy*   Lab Work: None If you have labs (blood work) drawn today and your tests are completely normal, you will receive your results only by: Marland Kitchen MyChart Message (if you have MyChart) OR . A paper copy in the mail If you have any lab test that is abnormal or we need to change your treatment, we will call you to review the results.   Testing/Procedures: None   Follow-Up: At [facility], you and your health needs are our priority.  As part of our continuing mission to provide you with exceptional heart care, we have created designated Provider Care Teams.  These Care Teams include your primary Cardiologist (physician) and Advanced Practice Providers (APPs -  Physician Assistants and Nurse Practitioners) who all work together to provide you with the care you need, when you need it.  We recommend signing up for the patient portal called "MyChart".  Sign up information is provided on this After Visit Summary.  MyChart is used to connect with patients for Virtual Visits (Telemedicine).  Patients are able to view lab/test results, encounter notes, upcoming appointments, etc.  Non-urgent messages can be sent to your provider as well.   To learn more about what you can do with MyChart, go to [email or web address].    Your next appointment:   3 month(s)  The format for your next appointment:   In Person  Provider:   Thomasene Ripple, DO   Other Instructions      Adopting a Healthy Lifestyle.  Know what a healthy weight is for you (roughly BMI <25) and aim to maintain this   Aim for 7+ servings of fruits and vegetables daily   65-80+ fluid ounces of water or unsweet tea for  healthy kidneys   Limit to max 1 drink of alcohol per day; avoid smoking/tobacco   Limit animal fats in diet for cholesterol and heart health - choose grass fed whenever available   Avoid highly processed foods, and foods high in saturated/trans fats   Aim for low stress - take time to unwind and care for  your mental health   Aim for 150 min of moderate intensity exercise weekly for heart health, and weights twice weekly for bone health   Aim for 7-9 hours of sleep daily   When it comes to diets, agreement about the perfect plan isnt easy to find, even among the experts. Experts at the [facility] [employer] developed an idea known as the Healthy Eating Plate. Just imagine a plate divided into logical, healthy portions.   The emphasis is on diet quality:   Load up on vegetables and fruits - one-half of your plate: Aim for color and variety, and remember that potatoes dont count.   Go for whole grains - one-quarter of your plate: Whole wheat, barley, wheat berries, quinoa, oats, brown rice, and foods made with them. If you want pasta, go with whole wheat pasta.   Protein power - one-quarter of your plate: Fish, chicken, beans, and nuts are all healthy, versatile protein sources. Limit red meat.   The diet, however, does go beyond the plate, offering a few other suggestions.   Use healthy plant oils, such as olive, canola, soy, corn, sunflower and peanut. Check the labels, and avoid partially hydrogenated oil, which have unhealthy trans fats.   If youre thirsty, drink water. Coffee and tea are good in moderation, but skip sugary drinks and limit milk and dairy products to one or two daily servings.   The type of carbohydrate in the diet is more important than the amount. Some sources of carbohydrates, such as vegetables, fruits, whole grains, and beans-are healthier than others.   Finally, stay active  Signed, Thomasene Ripple, DO  04/16/2019 3:30 PM    [facility]

## 2019-04-21 DIAGNOSIS — D649 Anemia, unspecified: Secondary | ICD-10-CM | POA: Diagnosis not present

## 2019-04-24 DIAGNOSIS — H26492 Other secondary cataract, left eye: Secondary | ICD-10-CM | POA: Diagnosis not present

## 2019-04-24 DIAGNOSIS — H40013 Open angle with borderline findings, low risk, bilateral: Secondary | ICD-10-CM | POA: Diagnosis not present

## 2019-04-24 DIAGNOSIS — H35372 Puckering of macula, left eye: Secondary | ICD-10-CM | POA: Diagnosis not present

## 2019-06-09 DIAGNOSIS — E559 Vitamin D deficiency, unspecified: Secondary | ICD-10-CM | POA: Diagnosis not present

## 2019-06-09 DIAGNOSIS — D649 Anemia, unspecified: Secondary | ICD-10-CM | POA: Diagnosis not present

## 2019-06-09 DIAGNOSIS — Z79899 Other long term (current) drug therapy: Secondary | ICD-10-CM | POA: Diagnosis not present

## 2019-06-09 DIAGNOSIS — E538 Deficiency of other specified B group vitamins: Secondary | ICD-10-CM | POA: Diagnosis not present

## 2019-06-09 DIAGNOSIS — E063 Autoimmune thyroiditis: Secondary | ICD-10-CM | POA: Diagnosis not present

## 2019-06-09 DIAGNOSIS — N400 Enlarged prostate without lower urinary tract symptoms: Secondary | ICD-10-CM | POA: Diagnosis not present

## 2019-06-09 DIAGNOSIS — I4891 Unspecified atrial fibrillation: Secondary | ICD-10-CM | POA: Diagnosis not present

## 2019-06-09 DIAGNOSIS — R739 Hyperglycemia, unspecified: Secondary | ICD-10-CM | POA: Diagnosis not present

## 2019-06-09 DIAGNOSIS — I5030 Unspecified diastolic (congestive) heart failure: Secondary | ICD-10-CM | POA: Diagnosis not present

## 2019-06-11 DIAGNOSIS — N401 Enlarged prostate with lower urinary tract symptoms: Secondary | ICD-10-CM | POA: Diagnosis not present

## 2019-06-11 DIAGNOSIS — R972 Elevated prostate specific antigen [PSA]: Secondary | ICD-10-CM | POA: Diagnosis not present

## 2019-06-24 ENCOUNTER — Telehealth: Payer: Self-pay

## 2019-06-24 NOTE — Telephone Encounter (Signed)
   [facility] Pre-operative Risk Assessment    Request for surgical clearance:  1. What type of surgery is being performed? Ultrasound Guided Prostate Biopsy   2. When is this surgery scheduled? TBD   3. What type of clearance is required (medical clearance vs. Pharmacy clearance to hold med vs. Both)? Medical/Pharmacy  4. Are there any medications that need to be held prior to surgery and how long? Eliquis for 7 days before and 7 days after surgery.    5. Practice name and name of physician performing surgery? [facility]. Dr Nila Nephew.   6. What is the office phone number?  [phone]   7.   What is the office fax number?  [phone]  8.   Anesthesia type (None, local, MAC, general) ? Unspecified   Jonathan Patterson 06/24/2019, 10:31 AM  _________________________________________________________________   (provider comments below)

## 2019-06-25 NOTE — Telephone Encounter (Signed)
   Primary Cardiologist: Thomasene Ripple, DO  Chart reviewed as part of pre-operative protocol coverage. Given past medical history and time since last visit, based on ACC/AHA guidelines, QUENCY TOBER would be at acceptable risk for the planned procedure without further cardiovascular testing.   Per pharmacy:  Patient with diagnosis of atrial fibrillation on Eliquis for anticoagulation.    Procedure: ultrasound guided prostate biopsy Date of procedure: TBD  CHADS2-VASc score of  3 (CHF, AGE, CAD)  CrCl 96.4 Platelet count 228  Per office protocol, patient can hold Eliquis for 3 days prior to procedure and 7 days after.     I will route this recommendation to the requesting party via Epic fax function and remove from pre-op pool.  Please call with questions.  Georgie Chard, NP 06/25/2019, 12:19 PM

## 2019-06-25 NOTE — Telephone Encounter (Signed)
Patient with diagnosis of atrial fibrillation on Eliquis for anticoagulation.    Procedure: ultrasound guided prostate biopsy Date of procedure: TBD  CHADS2-VASc score of  3 (CHF, AGE, CAD)  CrCl 96.4 Platelet count 228  Per office protocol, patient can hold Eliquis for 3 days prior to procedure and 7 days after.

## 2019-06-30 DIAGNOSIS — M545 Low back pain: Secondary | ICD-10-CM | POA: Diagnosis not present

## 2019-06-30 DIAGNOSIS — Z6822 Body mass index (BMI) 22.0-22.9, adult: Secondary | ICD-10-CM | POA: Diagnosis not present

## 2019-06-30 DIAGNOSIS — M5432 Sciatica, left side: Secondary | ICD-10-CM | POA: Diagnosis not present

## 2019-07-02 DIAGNOSIS — R972 Elevated prostate specific antigen [PSA]: Secondary | ICD-10-CM | POA: Diagnosis not present

## 2019-07-02 DIAGNOSIS — N401 Enlarged prostate with lower urinary tract symptoms: Secondary | ICD-10-CM | POA: Diagnosis not present

## 2019-07-08 HISTORY — PX: POLYPECTOMY: SHX149

## 2019-07-09 DIAGNOSIS — N401 Enlarged prostate with lower urinary tract symptoms: Secondary | ICD-10-CM | POA: Diagnosis not present

## 2019-07-09 DIAGNOSIS — R972 Elevated prostate specific antigen [PSA]: Secondary | ICD-10-CM | POA: Diagnosis not present

## 2019-07-16 DIAGNOSIS — R972 Elevated prostate specific antigen [PSA]: Secondary | ICD-10-CM | POA: Diagnosis not present

## 2019-07-16 DIAGNOSIS — N401 Enlarged prostate with lower urinary tract symptoms: Secondary | ICD-10-CM | POA: Diagnosis not present

## 2019-07-17 ENCOUNTER — Ambulatory Visit: Payer: Medicare HMO | Admitting: Cardiology

## 2019-08-18 NOTE — Progress Notes (Signed)
Cardiology Office Note:    Date:  08/19/2019   ID:  Jonathan Patterson, DOB March 17, 1945, MRN [id number]  PCP:  Guadalupe Maple., MD (Inactive)  Cardiologist:  Norman Herrlich, MD    Referring MD: No ref. provider found    ASSESSMENT:    1. PAF (paroxysmal atrial fibrillation) (HCC)   2. Chronic anticoagulation   3. Dilated cardiomyopathy (HCC)   4. Mild CAD    PLAN:    In order of problems listed above:  1. He continues to do well he is maintaining sinus rhythm continue his anticoagulant. 2. Cardiomyopathy recovered with resuming maintaining sinus rhythm he has no evidence of heart failure continue his current medical therapy including beta-blocker and ACE inhibitor 3. Stable CAD having no anginal discomfort   Next appointment: 6 months   Medication Adjustments/Labs and Tests Ordered: Current medicines are reviewed at length with the patient today.  Concerns regarding medicines are outlined above.  No orders of the defined types were placed in this encounter.  No orders of the defined types were placed in this encounter.   Chief Complaint  Patient presents with   Follow-up   Atrial Fibrillation   Anticoagulation   Cardiomyopathy    History of Present Illness:    Jonathan Patterson is a 74 y.o. male with a hx of  mild CAD, PAF and cardiomyopathy with heart failure from rapid AF.  Subsequent echocardiogram 10/22/2017 showed marked improvement that his previous severe left ventricular dysfunction had normalized with an ejection fraction of 55 to 60% with normal left ventricular size. Cardiac CTA performed 09/25/2017 shows a very low calcium score 5 and mild nonobstructive CAD    He was last seen 12/04/2018. Compliance with diet, lifestyle and medications: Yes  Continues to do well he is fully recovered has no cardiovascular symptoms of chest pain edema shortness of breath palpitation or syncope.  His real concern is affording medication especially Eliquis I offered him warfarin and  he declines.  Recent labs primary care physician 06/09/2019 A1c normal 5.6% BUN and creatinine normal 14/0.76 TSH normal 2.79 Past Medical History:  Diagnosis Date   Acute kidney injury (HCC)    Arrhythmia    Atrial fibrillation with RVR (HCC)    Cardiomyopathy (HCC)    Congestive heart failure (CHF) (HCC)    Diverticulitis    Elevated liver enzymes    Hypotension    Hypothyroid    Iron deficiency anemia    Mitral regurgitation     Past Surgical History:  Procedure Laterality Date   CATARACT EXTRACTION Bilateral    POLYPECTOMY  07/2019    Current Medications: Current Meds  Medication Sig   Calcium Carb-Cholecalciferol (CALCIUM + D3 PO) Take 1 tablet by mouth daily.   Cyanocobalamin (VITAMIN B12) 1000 MCG TBCR Take 1 tablet by mouth daily.   ELIQUIS 5 MG TABS tablet Take 5 mg by mouth 2 (two) times daily.   ferrous sulfate 324 MG TBEC Take 324 mg by mouth daily with breakfast.   furosemide (LASIX) 40 MG tablet Take 1 tablet on Mon, Wed, and Friday.  Take 1 tablet other days if weight is 164 lbs or greater   levothyroxine (SYNTHROID) 125 MCG tablet 125 mcg daily.   lisinopril (PRINIVIL,ZESTRIL) 2.5 MG tablet Take 2.5 mg by mouth daily.   metoprolol tartrate (LOPRESSOR) 25 MG tablet Take 25 mg by mouth 2 (two) times daily.   tamsulosin (FLOMAX) 0.4 MG CAPS capsule Take 1 capsule by mouth daily.  Allergies:   Patient has no known allergies.   Social History   Socioeconomic History   Marital status: Unknown    Spouse name: Not on file   Number of children: Not on file   Years of education: Not on file   Highest education level: Not on file  Occupational History   Not on file  Tobacco Use   Smoking status: Former Smoker    Packs/day: 3.00    Types: Cigarettes    Quit date: 17    Years since quitting: 23.5   Smokeless tobacco: Never Used  Building services engineer Use: Never used  Substance and Sexual Activity   Alcohol use: Not  Currently   Drug use: Not Currently   Sexual activity: Not on file  Other Topics Concern   Not on file  Social History Narrative   Not on file   Social Determinants of Health   Financial Resource Strain:    Difficulty of Paying Living Expenses:   Food Insecurity:    Worried About Programme researcher, broadcasting/film/video in the Last Year:    Barista in the Last Year:   Transportation Needs:    Freight forwarder (Medical):    Lack of Transportation (Non-Medical):   Physical Activity:    Days of Exercise per Week:    Minutes of Exercise per Session:   Stress:    Feeling of Stress :   Social Connections:    Frequency of Communication with Friends and Family:    Frequency of Social Gatherings with Friends and Family:    Attends Religious Services:    Active Member of Clubs or Organizations:    Attends Engineer, structural:    Marital Status:      Family History: The patient's family history includes Asthma in his daughter; Cancer in his mother; Diabetes in his father and sister. ROS:   Please see the history of present illness.    All other systems reviewed and are negative.  EKGs/Labs/Other Studies Reviewed:    The following studies were reviewed today:  EKG:  EKG ordered today and personally reviewed.  The ekg ordered today demonstrates sinus rhythm normal EKG  R  Physical Exam:    VS:  BP 104/74 (BP Location: Right Arm, Patient Position: Sitting, Cuff Size: Normal)    Pulse 64    Ht 6' (1.829 m)    Wt 167 lb (75.8 kg)    SpO2 98%    BMI 22.65 kg/m     Wt Readings from Last 3 Encounters:  08/19/19 167 lb (75.8 kg)  04/16/19 171 lb (77.6 kg)  12/04/18 169 lb 12.8 oz (77 kg)     GEN:  Well nourished, well developed in no acute distress HEENT: Normal NECK: No JVD; No carotid bruits LYMPHATICS: No lymphadenopathy CARDIAC: RRR, no murmurs, rubs, gallops RESPIRATORY:  Clear to auscultation without rales, wheezing or rhonchi  ABDOMEN: Soft,  non-tender, non-distended MUSCULOSKELETAL:  No edema; No deformity  SKIN: Warm and dry NEUROLOGIC:  Alert and oriented x 3 PSYCHIATRIC:  Normal affect    Signed, Norman Herrlich, MD  08/19/2019 2:33 PM    [facility]

## 2019-08-19 ENCOUNTER — Encounter: Payer: Self-pay | Admitting: Cardiology

## 2019-08-19 ENCOUNTER — Other Ambulatory Visit: Payer: Self-pay

## 2019-08-19 ENCOUNTER — Ambulatory Visit: Payer: Medicare HMO | Admitting: Cardiology

## 2019-08-19 VITALS — BP 104/74 | HR 64 | Ht 72.0 in | Wt 167.0 lb

## 2019-08-19 DIAGNOSIS — I420 Dilated cardiomyopathy: Secondary | ICD-10-CM

## 2019-08-19 DIAGNOSIS — Z7901 Long term (current) use of anticoagulants: Secondary | ICD-10-CM | POA: Diagnosis not present

## 2019-08-19 DIAGNOSIS — I480 Paroxysmal atrial fibrillation: Secondary | ICD-10-CM

## 2019-08-19 DIAGNOSIS — I2510 Atherosclerotic heart disease of native coronary artery without angina pectoris: Secondary | ICD-10-CM | POA: Diagnosis not present

## 2019-08-19 NOTE — Patient Instructions (Signed)

## 2019-09-09 DIAGNOSIS — R739 Hyperglycemia, unspecified: Secondary | ICD-10-CM | POA: Diagnosis not present

## 2019-09-09 DIAGNOSIS — N400 Enlarged prostate without lower urinary tract symptoms: Secondary | ICD-10-CM | POA: Diagnosis not present

## 2019-09-09 DIAGNOSIS — Z6823 Body mass index (BMI) 23.0-23.9, adult: Secondary | ICD-10-CM | POA: Diagnosis not present

## 2019-09-09 DIAGNOSIS — D649 Anemia, unspecified: Secondary | ICD-10-CM | POA: Diagnosis not present

## 2019-09-09 DIAGNOSIS — I5030 Unspecified diastolic (congestive) heart failure: Secondary | ICD-10-CM | POA: Diagnosis not present

## 2019-09-09 DIAGNOSIS — E559 Vitamin D deficiency, unspecified: Secondary | ICD-10-CM | POA: Diagnosis not present

## 2019-09-09 DIAGNOSIS — Z9181 History of falling: Secondary | ICD-10-CM | POA: Diagnosis not present

## 2019-09-09 DIAGNOSIS — E063 Autoimmune thyroiditis: Secondary | ICD-10-CM | POA: Diagnosis not present

## 2019-09-09 DIAGNOSIS — Z1331 Encounter for screening for depression: Secondary | ICD-10-CM | POA: Diagnosis not present

## 2019-12-16 DIAGNOSIS — Z79899 Other long term (current) drug therapy: Secondary | ICD-10-CM | POA: Diagnosis not present

## 2019-12-16 DIAGNOSIS — D649 Anemia, unspecified: Secondary | ICD-10-CM | POA: Diagnosis not present

## 2019-12-16 DIAGNOSIS — N400 Enlarged prostate without lower urinary tract symptoms: Secondary | ICD-10-CM | POA: Diagnosis not present

## 2019-12-16 DIAGNOSIS — E559 Vitamin D deficiency, unspecified: Secondary | ICD-10-CM | POA: Diagnosis not present

## 2019-12-16 DIAGNOSIS — R739 Hyperglycemia, unspecified: Secondary | ICD-10-CM | POA: Diagnosis not present

## 2019-12-16 DIAGNOSIS — E538 Deficiency of other specified B group vitamins: Secondary | ICD-10-CM | POA: Diagnosis not present

## 2019-12-16 DIAGNOSIS — I4891 Unspecified atrial fibrillation: Secondary | ICD-10-CM | POA: Diagnosis not present

## 2019-12-16 DIAGNOSIS — Z23 Encounter for immunization: Secondary | ICD-10-CM | POA: Diagnosis not present

## 2019-12-16 DIAGNOSIS — I5030 Unspecified diastolic (congestive) heart failure: Secondary | ICD-10-CM | POA: Diagnosis not present

## 2019-12-16 DIAGNOSIS — Z6822 Body mass index (BMI) 22.0-22.9, adult: Secondary | ICD-10-CM | POA: Diagnosis not present

## 2019-12-16 DIAGNOSIS — E063 Autoimmune thyroiditis: Secondary | ICD-10-CM | POA: Diagnosis not present

## 2020-01-17 DIAGNOSIS — R42 Dizziness and giddiness: Secondary | ICD-10-CM | POA: Diagnosis not present

## 2020-01-17 DIAGNOSIS — R4020 Unspecified coma: Secondary | ICD-10-CM | POA: Diagnosis not present

## 2020-01-17 DIAGNOSIS — I10 Essential (primary) hypertension: Secondary | ICD-10-CM | POA: Diagnosis not present

## 2020-01-17 DIAGNOSIS — R55 Syncope and collapse: Secondary | ICD-10-CM | POA: Diagnosis not present

## 2020-01-17 DIAGNOSIS — R9431 Abnormal electrocardiogram [ECG] [EKG]: Secondary | ICD-10-CM | POA: Diagnosis not present

## 2020-01-17 DIAGNOSIS — E871 Hypo-osmolality and hyponatremia: Secondary | ICD-10-CM | POA: Diagnosis not present

## 2020-01-18 DIAGNOSIS — R55 Syncope and collapse: Secondary | ICD-10-CM | POA: Diagnosis not present

## 2020-01-18 DIAGNOSIS — M7989 Other specified soft tissue disorders: Secondary | ICD-10-CM | POA: Diagnosis not present

## 2020-01-18 DIAGNOSIS — I340 Nonrheumatic mitral (valve) insufficiency: Secondary | ICD-10-CM | POA: Diagnosis not present

## 2020-01-18 DIAGNOSIS — R9431 Abnormal electrocardiogram [ECG] [EKG]: Secondary | ICD-10-CM | POA: Diagnosis not present

## 2020-01-18 DIAGNOSIS — Z7901 Long term (current) use of anticoagulants: Secondary | ICD-10-CM | POA: Diagnosis not present

## 2020-01-18 DIAGNOSIS — G319 Degenerative disease of nervous system, unspecified: Secondary | ICD-10-CM | POA: Diagnosis not present

## 2020-01-18 DIAGNOSIS — I272 Pulmonary hypertension, unspecified: Secondary | ICD-10-CM | POA: Diagnosis not present

## 2020-01-18 DIAGNOSIS — I5022 Chronic systolic (congestive) heart failure: Secondary | ICD-10-CM | POA: Diagnosis not present

## 2020-01-18 DIAGNOSIS — E039 Hypothyroidism, unspecified: Secondary | ICD-10-CM | POA: Diagnosis not present

## 2020-01-18 DIAGNOSIS — H748X1 Other specified disorders of right middle ear and mastoid: Secondary | ICD-10-CM | POA: Diagnosis not present

## 2020-01-18 DIAGNOSIS — I6529 Occlusion and stenosis of unspecified carotid artery: Secondary | ICD-10-CM | POA: Diagnosis not present

## 2020-01-18 DIAGNOSIS — I482 Chronic atrial fibrillation, unspecified: Secondary | ICD-10-CM | POA: Diagnosis not present

## 2020-01-18 DIAGNOSIS — E871 Hypo-osmolality and hyponatremia: Secondary | ICD-10-CM | POA: Diagnosis not present

## 2020-01-18 DIAGNOSIS — M25532 Pain in left wrist: Secondary | ICD-10-CM | POA: Diagnosis not present

## 2020-01-19 DIAGNOSIS — I482 Chronic atrial fibrillation, unspecified: Secondary | ICD-10-CM | POA: Diagnosis not present

## 2020-01-19 DIAGNOSIS — R55 Syncope and collapse: Secondary | ICD-10-CM | POA: Diagnosis not present

## 2020-01-19 DIAGNOSIS — I272 Pulmonary hypertension, unspecified: Secondary | ICD-10-CM | POA: Diagnosis not present

## 2020-01-29 DIAGNOSIS — E063 Autoimmune thyroiditis: Secondary | ICD-10-CM | POA: Diagnosis not present

## 2020-01-29 DIAGNOSIS — I5030 Unspecified diastolic (congestive) heart failure: Secondary | ICD-10-CM | POA: Diagnosis not present

## 2020-01-29 DIAGNOSIS — E871 Hypo-osmolality and hyponatremia: Secondary | ICD-10-CM | POA: Diagnosis not present

## 2020-01-29 DIAGNOSIS — Z6821 Body mass index (BMI) 21.0-21.9, adult: Secondary | ICD-10-CM | POA: Diagnosis not present

## 2020-01-29 DIAGNOSIS — I4891 Unspecified atrial fibrillation: Secondary | ICD-10-CM | POA: Diagnosis not present

## 2020-01-29 DIAGNOSIS — E559 Vitamin D deficiency, unspecified: Secondary | ICD-10-CM | POA: Diagnosis not present

## 2020-01-29 DIAGNOSIS — D649 Anemia, unspecified: Secondary | ICD-10-CM | POA: Diagnosis not present

## 2020-01-29 DIAGNOSIS — Z79899 Other long term (current) drug therapy: Secondary | ICD-10-CM | POA: Diagnosis not present

## 2020-01-29 DIAGNOSIS — R768 Other specified abnormal immunological findings in serum: Secondary | ICD-10-CM | POA: Diagnosis not present

## 2020-02-24 DIAGNOSIS — I340 Nonrheumatic mitral (valve) insufficiency: Secondary | ICD-10-CM | POA: Insufficient documentation

## 2020-02-24 DIAGNOSIS — R748 Abnormal levels of other serum enzymes: Secondary | ICD-10-CM | POA: Insufficient documentation

## 2020-02-24 DIAGNOSIS — N179 Acute kidney failure, unspecified: Secondary | ICD-10-CM | POA: Insufficient documentation

## 2020-02-24 DIAGNOSIS — I499 Cardiac arrhythmia, unspecified: Secondary | ICD-10-CM | POA: Insufficient documentation

## 2020-02-24 DIAGNOSIS — E039 Hypothyroidism, unspecified: Secondary | ICD-10-CM | POA: Insufficient documentation

## 2020-02-24 DIAGNOSIS — I429 Cardiomyopathy, unspecified: Secondary | ICD-10-CM | POA: Insufficient documentation

## 2020-02-24 DIAGNOSIS — I509 Heart failure, unspecified: Secondary | ICD-10-CM | POA: Insufficient documentation

## 2020-02-24 DIAGNOSIS — K5792 Diverticulitis of intestine, part unspecified, without perforation or abscess without bleeding: Secondary | ICD-10-CM | POA: Insufficient documentation

## 2020-02-24 DIAGNOSIS — I4891 Unspecified atrial fibrillation: Secondary | ICD-10-CM | POA: Insufficient documentation

## 2020-02-24 DIAGNOSIS — D509 Iron deficiency anemia, unspecified: Secondary | ICD-10-CM | POA: Insufficient documentation

## 2020-02-24 DIAGNOSIS — I959 Hypotension, unspecified: Secondary | ICD-10-CM | POA: Insufficient documentation

## 2020-03-04 ENCOUNTER — Ambulatory Visit: Payer: Medicare HMO | Admitting: Cardiology

## 2020-03-04 ENCOUNTER — Ambulatory Visit (INDEPENDENT_AMBULATORY_CARE_PROVIDER_SITE_OTHER): Payer: Medicare HMO

## 2020-03-04 ENCOUNTER — Encounter: Payer: Self-pay | Admitting: Cardiology

## 2020-03-04 ENCOUNTER — Other Ambulatory Visit: Payer: Self-pay

## 2020-03-04 VITALS — BP 112/70 | HR 82 | Ht 72.0 in | Wt 159.0 lb

## 2020-03-04 DIAGNOSIS — R55 Syncope and collapse: Secondary | ICD-10-CM

## 2020-03-04 DIAGNOSIS — I480 Paroxysmal atrial fibrillation: Secondary | ICD-10-CM | POA: Diagnosis not present

## 2020-03-04 DIAGNOSIS — I420 Dilated cardiomyopathy: Secondary | ICD-10-CM

## 2020-03-04 DIAGNOSIS — I2510 Atherosclerotic heart disease of native coronary artery without angina pectoris: Secondary | ICD-10-CM

## 2020-03-04 DIAGNOSIS — Z7901 Long term (current) use of anticoagulants: Secondary | ICD-10-CM | POA: Diagnosis not present

## 2020-03-04 NOTE — Progress Notes (Signed)
Cardiology Office Note:    Date:  03/04/2020   ID:  [name], DOB May 19, 1945, MRN [id number]  PCP:  Gordy Councilman, FNP  Cardiologist:  Norman Herrlich, MD    Referring MD: Guadalupe Maple., MD    ASSESSMENT:    1. Syncope and collapse   2. PAF (paroxysmal atrial fibrillation) (HCC)   3. Chronic anticoagulation   4. Dilated cardiomyopathy (HCC)   5. Mild CAD    PLAN:    In order of problems listed above:  1. His episode was quite characteristic of orthostatic hypotension however he has no demonstrable shift on exam.  His arterial pressure is relatively low ejection fraction is normalized I think at this time he is best served by stopping his ACE inhibitor continue his loop diuretic and his beta-blocker.  For further evaluation he will use a live monitor for 1 week. 2. Stable no clinical recurrence he remains anticoagulated 3. EF had normalized on echocardiogram my office and confirmed at the hospital 4. Stable no anginal discomfort on current therapy   Next appointment: 6 weeks   Medication Adjustments/Labs and Tests Ordered: Current medicines are reviewed at length with the patient today.  Concerns regarding medicines are outlined above.  Orders Placed This Encounter  Procedures  . LONG TERM MONITOR-LIVE TELEMETRY (3-14 DAYS)   No orders of the defined types were placed in this encounter.   No chief complaint on file.   History of Present Illness:    Jonathan Patterson is a 75 y.o. male with a hx of CAD paroxysmal atrial fibrillation and tachycardia induced  cardiomyopathy last seen 08/19/2019.  Subsequent echocardiogram September 2019 showed improvement and normalization ejection fraction 55 to 60%.  Cardiac CTA performed in August 2019 showed a very low calcium score 5 and mild nonobstructive CAD.  Compliance with diet, lifestyle and medications: Yes  I reviewed the hospital records for the visit His symptoms are very characteristic of orthostatic hypotension he had  been sitting for a while stood up felt lightheaded and felt as if he would faint.  Although the ED record says he is syncope he tells me he is not quite sure he ever went out and he said he had no ictal activity he has no history of a seizure disorder he had no incontinence did not bite his tongue.  He has not had any other episodes.  Otherwise he is doing well he works no exercise intolerance palpitations shortness of breath or chest pain.  He is compliant with his medications.  He was at [facility] 01/18/2020 and discharged the next day with a discharge diagnosis of syncope.  The episode was witnessed and his wife describes seizure-like activity.  When he is evaluated in the emergency room he had normal vital signs and no orthostatic shift.  An extensive evaluation normal cardiac troponin proBNP level was not elevated chest x-ray was clear.  He was advised after discharge to wear ambulatory event monitor.  Hemoglobin 13.1 potassium 4.5 creatinine 0.60.  EKG showed sinus rhythm age-indeterminate anterior MI similar to previous EKG in July 2019 CT of the head showed no acute intracranial abnormality serial troponins were assessed they remained undetectable an echocardiogram performed in the hospital that day which showed an ejection fraction of 55 to 60% left atrial pressure aortic valve described as quad cusp with normal function I independently reviewed the EKG showing sinus rhythm with T wave abnormalities.  He did not have a neurology consultation  Past Medical History:  Diagnosis Date  . Acute kidney injury (HCC)   . Arrhythmia   . Atrial fibrillation with RVR (HCC)   . Cardiomyopathy (HCC)   . Congestive heart failure (CHF) (HCC)   . Diverticulitis   . Elevated liver enzymes   . Hypotension   . Hypothyroid   . Iron deficiency anemia   . Mitral regurgitation     Past Surgical History:  Procedure Laterality Date  . CATARACT EXTRACTION Bilateral   . POLYPECTOMY  07/2019    Current  Medications: Current Meds  Medication Sig  . Calcium Carb-Cholecalciferol (CALCIUM + D3 PO) Take 1 tablet by mouth daily.  . Cyanocobalamin (VITAMIN B12) 1000 MCG TBCR Take 1 tablet by mouth daily.  Marland Kitchen ELIQUIS 5 MG TABS tablet Take 5 mg by mouth 2 (two) times daily.  . ferrous sulfate 324 MG TBEC Take 324 mg by mouth daily with breakfast.  . furosemide (LASIX) 40 MG tablet Take 1 tablet on Mon, Wed, and Friday.  Take 1 tablet other days if weight is 164 lbs or greater  . levothyroxine (SYNTHROID) 137 MCG tablet 137 mcg daily.  . metoprolol tartrate (LOPRESSOR) 25 MG tablet Take 25 mg by mouth 2 (two) times daily.  . tamsulosin (FLOMAX) 0.4 MG CAPS capsule Take 1 capsule by mouth daily.  . [DISCONTINUED] lisinopril (PRINIVIL,ZESTRIL) 2.5 MG tablet Take 2.5 mg by mouth daily.     Allergies:   Patient has no known allergies.   Social History   Socioeconomic History  . Marital status: Unknown    Spouse name: Not on file  . Number of children: Not on file  . Years of education: Not on file  . Highest education level: Not on file  Occupational History  . Not on file  Tobacco Use  . Smoking status: Former Smoker    Packs/day: 3.00    Types: Cigarettes    Quit date: 1998    Years since quitting: 24.0  . Smokeless tobacco: Never Used  Vaping Use  . Vaping Use: Never used  Substance and Sexual Activity  . Alcohol use: Not Currently  . Drug use: Not Currently  . Sexual activity: Not on file  Other Topics Concern  . Not on file  Social History Narrative  . Not on file   Social Determinants of Health   Financial Resource Strain: Not on file  Food Insecurity: Not on file  Transportation Needs: Not on file  Physical Activity: Not on file  Stress: Not on file  Social Connections: Not on file     Family History: The patient's family history includes Asthma in his daughter; Cancer in his mother; Diabetes in his father and sister. ROS:   Please see the history of present illness.     All other systems reviewed and are negative.  EKGs/Labs/Other Studies Reviewed:    The following studies were reviewed today:    Physical Exam:    VS:  BP 112/70   Pulse 82   Ht 6' (1.829 m)   Wt 159 lb (72.1 kg)   SpO2 96%   BMI 21.56 kg/m     Wt Readings from Last 3 Encounters:  03/04/20 159 lb (72.1 kg)  08/19/19 167 lb (75.8 kg)  04/16/19 171 lb (77.6 kg)    I repeated blood pressure is 114/70 standing 112/70 sitting. GEN:  Well nourished, well developed in no acute distress HEENT: Normal NECK: No JVD; No carotid bruits LYMPHATICS: No lymphadenopathy CARDIAC: RRR, no murmurs, rubs, gallops RESPIRATORY:  Clear to auscultation without rales, wheezing or rhonchi  ABDOMEN: Soft, non-tender, non-distended MUSCULOSKELETAL:  No edema; No deformity  SKIN: Warm and dry NEUROLOGIC:  Alert and oriented x 3 PSYCHIATRIC:  Normal affect    Signed, Norman Herrlich, MD  03/04/2020 4:23 PM    [facility]

## 2020-03-04 NOTE — Patient Instructions (Signed)
Medication Instructions:  Your physician has recommended you make the following change in your medication:  STOP: Lisinopril  *If you need a refill on your cardiac medications before your next appointment, please call your pharmacy*   Lab Work: None If you have labs (blood work) drawn today and your tests are completely normal, you will receive your results only by: Marland Kitchen MyChart Message (if you have MyChart) OR . A paper copy in the mail If you have any lab test that is abnormal or we need to change your treatment, we will call you to review the results.   Testing/Procedures: A zio monitor was ordered today. It will remain on for 7 days. You will then return monitor and event diary in provided box. It takes 1-2 weeks for report to be downloaded and returned to [place]. We will call you with the results. If monitor falls off or has orange flashing light, please call Zio for further instructions.      Follow-Up: At [facility], you and your health needs are our priority.  As part of our continuing mission to provide you with exceptional heart care, we have created designated Provider Care Teams.  These Care Teams include your primary Cardiologist (physician) and Advanced Practice Providers (APPs -  Physician Assistants and Nurse Practitioners) who all work together to provide you with the care you need, when you need it.  We recommend signing up for the patient portal called "MyChart".  Sign up information is provided on this After Visit Summary.  MyChart is used to connect with patients for Virtual Visits (Telemedicine).  Patients are able to view lab/test results, encounter notes, upcoming appointments, etc.  Non-urgent messages can be sent to your provider as well.   To learn more about what you can do with MyChart, go to [email or web address].    Your next appointment:   6 week(s)  The format for your next appointment:   In Person  Provider:   Norman Herrlich, MD   Other  Instructions

## 2020-03-05 DIAGNOSIS — R55 Syncope and collapse: Secondary | ICD-10-CM | POA: Diagnosis not present

## 2020-03-18 DIAGNOSIS — R739 Hyperglycemia, unspecified: Secondary | ICD-10-CM | POA: Diagnosis not present

## 2020-03-18 DIAGNOSIS — Z79899 Other long term (current) drug therapy: Secondary | ICD-10-CM | POA: Diagnosis not present

## 2020-03-18 DIAGNOSIS — E538 Deficiency of other specified B group vitamins: Secondary | ICD-10-CM | POA: Diagnosis not present

## 2020-03-18 DIAGNOSIS — I5030 Unspecified diastolic (congestive) heart failure: Secondary | ICD-10-CM | POA: Diagnosis not present

## 2020-03-18 DIAGNOSIS — D649 Anemia, unspecified: Secondary | ICD-10-CM | POA: Diagnosis not present

## 2020-03-18 DIAGNOSIS — I4891 Unspecified atrial fibrillation: Secondary | ICD-10-CM | POA: Diagnosis not present

## 2020-03-18 DIAGNOSIS — N400 Enlarged prostate without lower urinary tract symptoms: Secondary | ICD-10-CM | POA: Diagnosis not present

## 2020-03-18 DIAGNOSIS — E063 Autoimmune thyroiditis: Secondary | ICD-10-CM | POA: Diagnosis not present

## 2020-03-18 DIAGNOSIS — E559 Vitamin D deficiency, unspecified: Secondary | ICD-10-CM | POA: Diagnosis not present

## 2020-03-18 DIAGNOSIS — Z6822 Body mass index (BMI) 22.0-22.9, adult: Secondary | ICD-10-CM | POA: Diagnosis not present

## 2020-03-30 ENCOUNTER — Telehealth: Payer: Self-pay

## 2020-03-30 DIAGNOSIS — R972 Elevated prostate specific antigen [PSA]: Secondary | ICD-10-CM | POA: Diagnosis not present

## 2020-03-30 DIAGNOSIS — N401 Enlarged prostate with lower urinary tract symptoms: Secondary | ICD-10-CM | POA: Diagnosis not present

## 2020-03-30 NOTE — Telephone Encounter (Signed)
Left message on patients voicemail to please return our call.   

## 2020-03-30 NOTE — Telephone Encounter (Signed)
-----   Message from Brian J Munley, MD sent at 03/30/2020  9:02 AM EST ----- Stable report no slow heart rates to account for his loss of consciousness.  No change in treatment at this time.  If he has another episode of syncope I like him to call the office and at that point I would refer him for an implanted loop recorder. 

## 2020-03-31 ENCOUNTER — Telehealth: Payer: Self-pay

## 2020-03-31 NOTE — Telephone Encounter (Signed)
-----   Message from Baldo Daub, MD sent at 03/30/2020  9:02 AM EST ----- Stable report no slow heart rates to account for his loss of consciousness.  No change in treatment at this time.  If he has another episode of syncope I like him to call the office and at that point I would refer him for an implanted loop recorder.

## 2020-03-31 NOTE — Telephone Encounter (Signed)
Spoke with patient regarding results and recommendation.  Patient verbalizes understanding and is agreeable to plan of care. Advised patient to call back with any issues or concerns.  

## 2020-04-02 DIAGNOSIS — R5383 Other fatigue: Secondary | ICD-10-CM | POA: Diagnosis not present

## 2020-04-02 DIAGNOSIS — R768 Other specified abnormal immunological findings in serum: Secondary | ICD-10-CM | POA: Diagnosis not present

## 2020-04-02 DIAGNOSIS — Z6821 Body mass index (BMI) 21.0-21.9, adult: Secondary | ICD-10-CM | POA: Diagnosis not present

## 2020-04-02 DIAGNOSIS — M2550 Pain in unspecified joint: Secondary | ICD-10-CM | POA: Diagnosis not present

## 2020-04-08 DIAGNOSIS — H35372 Puckering of macula, left eye: Secondary | ICD-10-CM | POA: Diagnosis not present

## 2020-04-08 DIAGNOSIS — H5212 Myopia, left eye: Secondary | ICD-10-CM | POA: Diagnosis not present

## 2020-04-08 DIAGNOSIS — H40013 Open angle with borderline findings, low risk, bilateral: Secondary | ICD-10-CM | POA: Diagnosis not present

## 2020-04-08 DIAGNOSIS — H26492 Other secondary cataract, left eye: Secondary | ICD-10-CM | POA: Diagnosis not present

## 2020-04-08 DIAGNOSIS — H5201 Hypermetropia, right eye: Secondary | ICD-10-CM | POA: Diagnosis not present

## 2020-04-15 ENCOUNTER — Encounter: Payer: Self-pay | Admitting: Cardiology

## 2020-04-15 ENCOUNTER — Other Ambulatory Visit: Payer: Self-pay

## 2020-04-15 ENCOUNTER — Ambulatory Visit: Payer: Medicare HMO | Admitting: Cardiology

## 2020-04-15 ENCOUNTER — Other Ambulatory Visit: Payer: Self-pay | Admitting: Urology

## 2020-04-15 VITALS — BP 106/60 | HR 70 | Ht 72.0 in | Wt 166.2 lb

## 2020-04-15 DIAGNOSIS — R55 Syncope and collapse: Secondary | ICD-10-CM | POA: Diagnosis not present

## 2020-04-15 DIAGNOSIS — I480 Paroxysmal atrial fibrillation: Secondary | ICD-10-CM | POA: Diagnosis not present

## 2020-04-15 DIAGNOSIS — Z7901 Long term (current) use of anticoagulants: Secondary | ICD-10-CM | POA: Diagnosis not present

## 2020-04-15 MED ORDER — FUROSEMIDE 40 MG PO TABS: 40.0000 mg | ORAL_TABLET | ORAL | 3 refills | Status: DC

## 2020-04-15 NOTE — Progress Notes (Signed)
Cardiology Office Note:    Date:  04/15/2020   ID:  Jonathan Patterson, DOB Jul 01, 1945, MRN [id number]  PCP:  Gordy Councilman, FNP  Cardiologist:  Norman Herrlich, MD    Referring MD: Gordy Councilman, FNP    ASSESSMENT:    1. Syncope and collapse   2. PAF (paroxysmal atrial fibrillation) (HCC)   3. Chronic anticoagulation    PLAN:    In order of problems listed above:  1. Clinically his episode was orthostatic in nature no recurrence order to reduce his diuretic to a bare minimum and I told him that if he notices no edema he could stop it.  He will remain on Eliquis with his previous atrial fibrillation his beta-blocker and I will see back in the office in 1 year or sooner.   Next appointment: 1 year   Medication Adjustments/Labs and Tests Ordered: Current medicines are reviewed at length with the patient today.  Concerns regarding medicines are outlined above.  No orders of the defined types were placed in this encounter.  Meds ordered this encounter  Medications   furosemide (LASIX) 40 MG tablet    Sig: Take 1 tablet (40 mg total) by mouth once a week. Take 1 tablet on Mon, Wed, and Friday.  Take 1 tablet other days if weight is 164 lbs or greater    Dispense:  16 tablet    Refill:  3    Chief Complaint  Patient presents with   Follow-up   Loss of Consciousness    History of Present Illness:    Jonathan Patterson is a 75 y.o. male with a hx of syncope paroxysmal atrial fibrillation with chronic anticoagulation cardiomyopathy with normalization of ejection fraction on echocardiogram and mild CAD with a calcium score of 5 last seen 03/04/2020.  He had episodes of near syncope and seen to be quite characteristic of orthostatic hypotension  Compliance with diet, lifestyle and medications: Yes  He has had no recurrence of lightheadedness or syncope.  I reviewed the results of his device with him we discussed the option of implanted loop recorder he prefers not to he has no  evidence of volume overload and reduce his diuretic to once a week.  No edema shortness of breath chest pain palpitation or syncope  Following his visit he utilized a event monitor for 14 days that showed occasional ventricular ectopy with couplets triplets and one 4 beat run of PVCs also brief episodes of atrial tachycardia but no bradycardia was demonstrated.  He was at [facility] 01/18/2020 and discharged the next day with a discharge diagnosis of syncope.  The episode was witnessed and his wife describes seizure-like activity.  When he was evaluated in the emergency room he had normal vital signs and no orthostatic shift.  An extensive evaluation normal cardiac troponin proBNP level was not elevated chest x-ray was clear.  He was advised after discharge to wear ambulatory event monitor.  Hemoglobin 13.1 potassium 4.5 creatinine 0.60.  EKG showed sinus rhythm age-indeterminate anterior MI similar to previous EKG in July 2019 CT of the head showed no acute intracranial abnormality serial troponins were assessed they remained undetectable an echocardiogram performed in the hospital that day which showed an ejection fraction of 55 to 60% left atrial pressure I independently reviewed the EKG showing sinus rhythm with T wave abnormalities.  He did not have a neurology consultation   Past Medical History:  Diagnosis Date   Acute kidney injury (HCC)  Arrhythmia    Atrial fibrillation with RVR (HCC)    Cardiomyopathy (HCC)    Congestive heart failure (CHF) (HCC)    Diverticulitis    Elevated liver enzymes    Hypotension    Hypothyroid    Iron deficiency anemia    Mitral regurgitation     Past Surgical History:  Procedure Laterality Date   CATARACT EXTRACTION Bilateral    POLYPECTOMY  07/2019    Current Medications: Current Meds  Medication Sig   Calcium Carb-Cholecalciferol (CALCIUM + D3 PO) Take 1 tablet by mouth daily.   Cyanocobalamin (VITAMIN B12) 1000 MCG TBCR  Take 1 tablet by mouth daily.   ELIQUIS 5 MG TABS tablet Take 5 mg by mouth 2 (two) times daily.   ferrous sulfate 324 MG TBEC Take 324 mg by mouth daily with breakfast.   levothyroxine (SYNTHROID) 125 MCG tablet Take 125 mcg by mouth daily.   metoprolol tartrate (LOPRESSOR) 25 MG tablet Take 25 mg by mouth 2 (two) times daily.   oxyCODONE (OXY IR/ROXICODONE) 5 MG immediate release tablet Take 1 tablet by mouth daily as needed.   tamsulosin (FLOMAX) 0.4 MG CAPS capsule Take 1 capsule by mouth daily.   [DISCONTINUED] furosemide (LASIX) 40 MG tablet Take 1 tablet on Mon, Wed, and Friday.  Take 1 tablet other days if weight is 164 lbs or greater     Allergies:   Patient has no known allergies.   Social History   Socioeconomic History   Marital status: Unknown    Spouse name: Not on file   Number of children: Not on file   Years of education: Not on file   Highest education level: Not on file  Occupational History   Not on file  Tobacco Use   Smoking status: Former Smoker    Packs/day: 3.00    Types: Cigarettes    Quit date: 45    Years since quitting: 24.2   Smokeless tobacco: Never Used  Building services engineer Use: Never used  Substance and Sexual Activity   Alcohol use: Not Currently   Drug use: Not Currently   Sexual activity: Not on file  Other Topics Concern   Not on file  Social History Narrative   Not on file   Social Determinants of Health   Financial Resource Strain: Not on file  Food Insecurity: Not on file  Transportation Needs: Not on file  Physical Activity: Not on file  Stress: Not on file  Social Connections: Not on file     Family History: The patient's family history includes Asthma in his daughter; Cancer in his mother; Diabetes in his father and sister. ROS:   Please see the history of present illness.    All other systems reviewed and are negative.  EKGs/Labs/Other Studies Reviewed:    The following studies were reviewed  today:   Physical Exam:    VS:  BP 106/60    Pulse 70    Ht 6' (1.829 m)    Wt 166 lb 3.2 oz (75.4 kg)    SpO2 97%    BMI 22.54 kg/m     Wt Readings from Last 3 Encounters:  04/15/20 166 lb 3.2 oz (75.4 kg)  03/04/20 159 lb (72.1 kg)  08/19/19 167 lb (75.8 kg)     GEN:  Well nourished, well developed in no acute distress HEENT: Normal NECK: No JVD; No carotid bruits LYMPHATICS: No lymphadenopathy CARDIAC: RRR, no murmurs, rubs, gallops RESPIRATORY:  Clear to auscultation  without rales, wheezing or rhonchi  ABDOMEN: Soft, non-tender, non-distended MUSCULOSKELETAL:  No edema; No deformity  SKIN: Warm and dry NEUROLOGIC:  Alert and oriented x 3 PSYCHIATRIC:  Normal affect    Signed, Norman Herrlich, MD  04/15/2020 4:14 PM    [facility]

## 2020-04-15 NOTE — Patient Instructions (Signed)
Medication Instructions:  Your physician has recommended you make the following change in your medication:  DECREASE: Furosemide 40 mg take one tablet by mouth once weekly.  *If you need a refill on your cardiac medications before your next appointment, please call your pharmacy*   Lab Work: None If you have labs (blood work) drawn today and your tests are completely normal, you will receive your results only by: Marland Kitchen MyChart Message (if you have MyChart) OR . A paper copy in the mail If you have any lab test that is abnormal or we need to change your treatment, we will call you to review the results.   Testing/Procedures: None   Follow-Up: At [facility], you and your health needs are our priority.  As part of our continuing mission to provide you with exceptional heart care, we have created designated Provider Care Teams.  These Care Teams include your primary Cardiologist (physician) and Advanced Practice Providers (APPs -  Physician Assistants and Nurse Practitioners) who all work together to provide you with the care you need, when you need it.  We recommend signing up for the patient portal called "MyChart".  Sign up information is provided on this After Visit Summary.  MyChart is used to connect with patients for Virtual Visits (Telemedicine).  Patients are able to view lab/test results, encounter notes, upcoming appointments, etc.  Non-urgent messages can be sent to your provider as well.   To learn more about what you can do with MyChart, go to [email or web address].    Your next appointment:   1 year(s)  The format for your next appointment:   In Person  Provider:   Norman Herrlich, MD   Other Instructions

## 2020-04-16 ENCOUNTER — Other Ambulatory Visit: Payer: Self-pay | Admitting: Urology

## 2020-04-16 DIAGNOSIS — R972 Elevated prostate specific antigen [PSA]: Secondary | ICD-10-CM

## 2020-05-04 IMAGING — CT CT HEART MORP W/ CTA COR W/ SCORE W/ CA W/CM &/OR W/O CM
4 of 7 series · 8 of 20 positions shown, 9 images · IV contrast (APPLIED)
Comparison: None.

CLINICAL DATA: Cardiomyopathy

EXAM:
Cardiac CTA
MEDICATIONS:
Sub lingual nitro. 4mg and lopressor 10mg
TECHNIQUE: The patient was scanned on a [employer] [REDACTED]ice scanner. Gantry
rotation speed was 270 msecs. Collimation was .9mm. A 100 kV
prospective scan was triggered in the descending thoracic aorta at
111 HU's with 5% padding centered around 78% of the R-R interval.
Average HR during the scan was 60 bpm. The 3D data set was
interpreted on a dedicated work station using MPR, MIP and VRT
modes. A total of 80cc of contrast was used.

[Series 7: best diast 67 % · axial · 0.39mm/px · z∈[+504,+549]mm · 2 of 336 slices shown, 3 images]
[im 112/336  vessel]
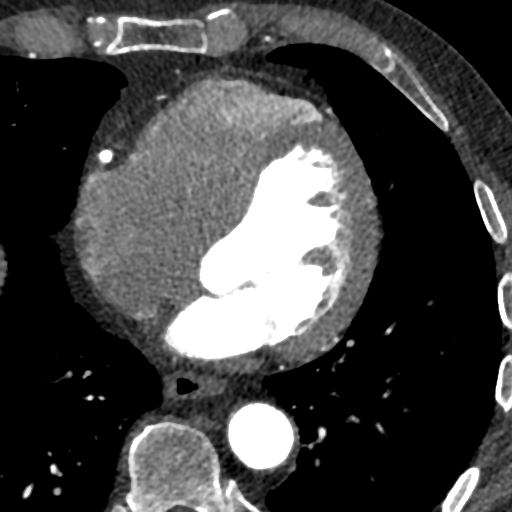
[im 112/336  lung]
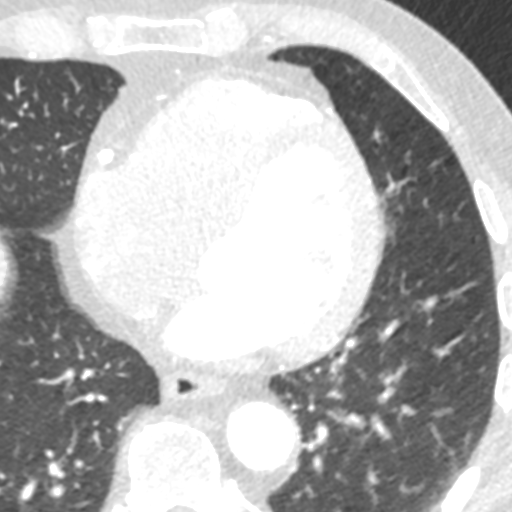
[im 224/336  vessel]
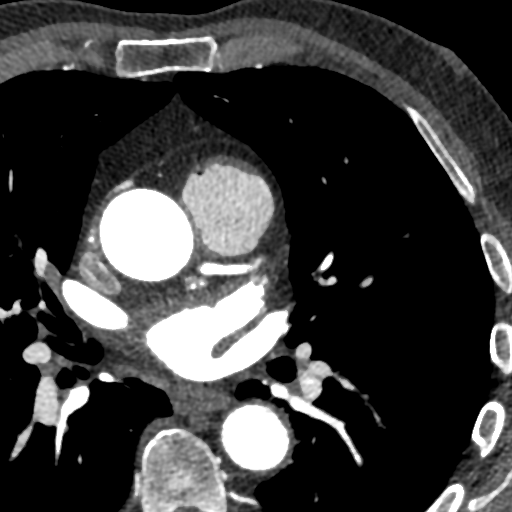

[Series 8: best syst 40 % · axial · 0.39mm/px · z∈[+504,+549]mm · 2 of 336 slices shown]
[im 112/336  vessel]
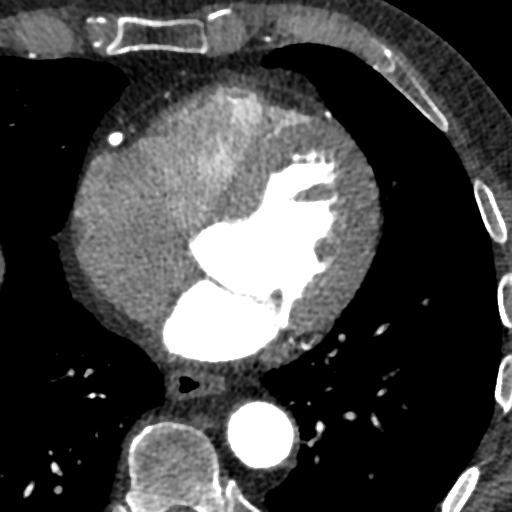
[im 224/336  vessel]
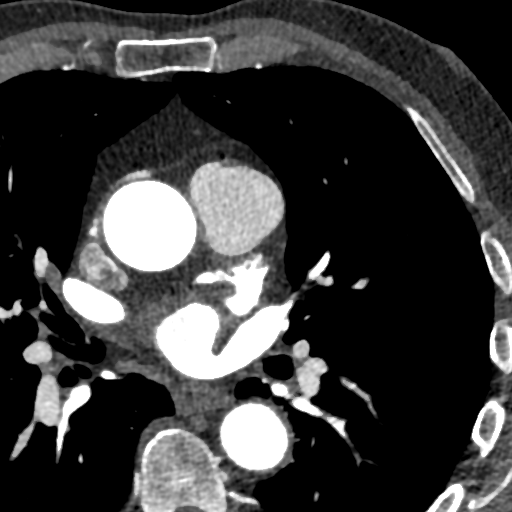

[Series 9: ts diast sharp 67 % · axial · 0.39mm/px · z∈[+504,+549]mm · 2 of 336 slices shown]
[im 112/336  lung]
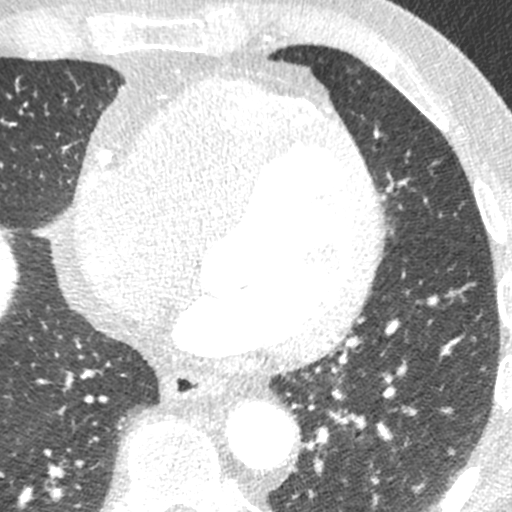
[im 224/336  lung]
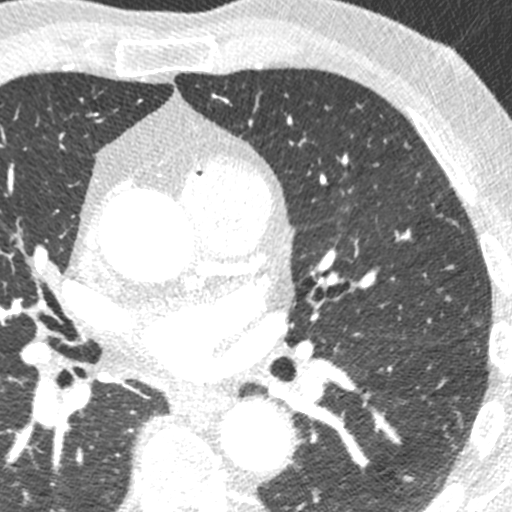

[Series 10: ts syst sharp 40 % · axial · 0.39mm/px · z∈[+504,+549]mm · 2 of 336 slices shown]
[im 112/336  lung]
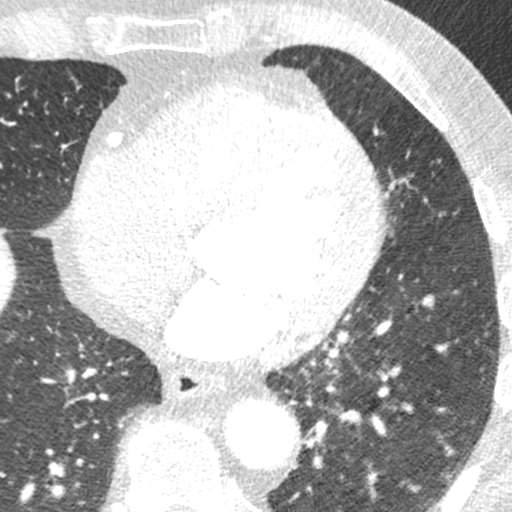
[im 224/336  lung]
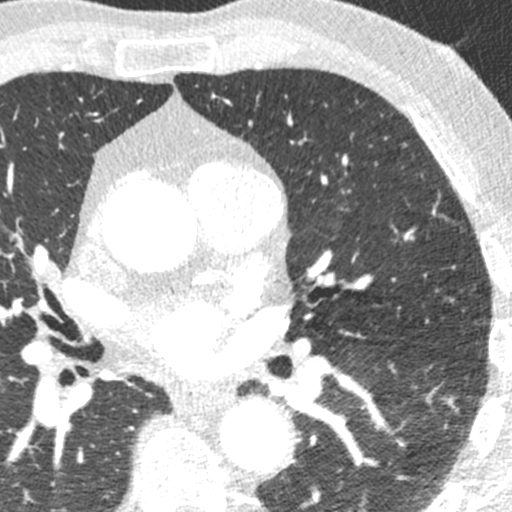

[8 of 20 positions shown; findings below may reference images not displayed]

FINDINGS: Non-cardiac: See separate report from [REDACTED]. No
significant findings on limited lung and soft tissue windows.

Calcium Score: Mild calcium noted in proximal / mid LAD and RCA

Coronary Arteries: Right dominant with no anomalies

LM: Normal

LAD: Less than 30% mixed plaque in proximal and mid LAD

D1: Less than 30% mixed plaque in proximal vessel

D2: Normal

Circumflex: Angulated proximal vessel without stenosis

OM1: Normal

OM2: Normal

RCA: Less than 30% mixed plaque in proximal and mid vessel

PDA: Normal

PLA: Normal
IMPRESSION: 1. Calcium score 5 which is 17 th percentile for age and sex

2.  Non obstructive CAD

3.  Normal aortic root 3.6 cm

Arezoo Yazzie

EXAM:
OVER-READ INTERPRETATION  CT CHEST

The following report is an over-read performed by radiologist Dr.
Yudy Estes [[email or web address]] on 09/25/2017. This over-read
does not include interpretation of cardiac or coronary anatomy or
pathology. The coronary CTA interpretation by the cardiologist is
attached.
FINDINGS: Vascular: Heart is normal size.  Visualized aorta normal caliber.

Mediastinum/Nodes: No adenopathy in the lower mediastinum or hila.

Lungs/Pleura: Visualized lungs clear.  No effusions.

Upper Abdomen: Imaging into the upper abdomen shows no acute
findings.

Musculoskeletal: Chest wall soft tissues are unremarkable. No acute
bony abnormality.
IMPRESSION: No acute or significant extracardiac abnormality.

## 2020-05-09 ENCOUNTER — Ambulatory Visit
Admission: RE | Admit: 2020-05-09 | Discharge: 2020-05-09 | Disposition: A | Discharge status: Home / Self Care | Payer: Medicare HMO | Source: Ambulatory Visit | Attending: Urology | Admitting: Urology

## 2020-05-09 ENCOUNTER — Other Ambulatory Visit: Payer: Self-pay

## 2020-05-09 DIAGNOSIS — R972 Elevated prostate specific antigen [PSA]: Secondary | ICD-10-CM

## 2020-05-09 MED ORDER — GADOBENATE DIMEGLUMINE 529 MG/ML IV SOLN
15.0000 mL | Freq: Once | INTRAVENOUS | Status: AC | PRN
  Administered 2020-05-09: 15 mL via INTRAVENOUS

## 2020-06-15 DIAGNOSIS — N400 Enlarged prostate without lower urinary tract symptoms: Secondary | ICD-10-CM | POA: Diagnosis not present

## 2020-06-15 DIAGNOSIS — I4891 Unspecified atrial fibrillation: Secondary | ICD-10-CM | POA: Diagnosis not present

## 2020-06-15 DIAGNOSIS — E063 Autoimmune thyroiditis: Secondary | ICD-10-CM | POA: Diagnosis not present

## 2020-06-15 DIAGNOSIS — E538 Deficiency of other specified B group vitamins: Secondary | ICD-10-CM | POA: Diagnosis not present

## 2020-06-15 DIAGNOSIS — R739 Hyperglycemia, unspecified: Secondary | ICD-10-CM | POA: Diagnosis not present

## 2020-06-15 DIAGNOSIS — I5030 Unspecified diastolic (congestive) heart failure: Secondary | ICD-10-CM | POA: Diagnosis not present

## 2020-06-15 DIAGNOSIS — E559 Vitamin D deficiency, unspecified: Secondary | ICD-10-CM | POA: Diagnosis not present

## 2020-06-15 DIAGNOSIS — Z6823 Body mass index (BMI) 23.0-23.9, adult: Secondary | ICD-10-CM | POA: Diagnosis not present

## 2020-06-15 DIAGNOSIS — D649 Anemia, unspecified: Secondary | ICD-10-CM | POA: Diagnosis not present

## 2020-07-03 DIAGNOSIS — K3189 Other diseases of stomach and duodenum: Secondary | ICD-10-CM | POA: Diagnosis not present

## 2020-07-03 DIAGNOSIS — N3289 Other specified disorders of bladder: Secondary | ICD-10-CM | POA: Diagnosis not present

## 2020-07-03 DIAGNOSIS — R1032 Left lower quadrant pain: Secondary | ICD-10-CM | POA: Diagnosis not present

## 2020-07-03 DIAGNOSIS — I700 Atherosclerosis of aorta: Secondary | ICD-10-CM | POA: Diagnosis not present

## 2020-07-03 DIAGNOSIS — B029 Zoster without complications: Secondary | ICD-10-CM | POA: Diagnosis not present

## 2020-07-03 DIAGNOSIS — R531 Weakness: Secondary | ICD-10-CM | POA: Diagnosis not present

## 2020-07-03 DIAGNOSIS — R918 Other nonspecific abnormal finding of lung field: Secondary | ICD-10-CM | POA: Diagnosis not present

## 2020-07-03 DIAGNOSIS — K5790 Diverticulosis of intestine, part unspecified, without perforation or abscess without bleeding: Secondary | ICD-10-CM | POA: Diagnosis not present

## 2020-07-03 DIAGNOSIS — R3121 Asymptomatic microscopic hematuria: Secondary | ICD-10-CM | POA: Diagnosis not present

## 2020-07-03 DIAGNOSIS — R079 Chest pain, unspecified: Secondary | ICD-10-CM | POA: Diagnosis not present

## 2020-07-03 DIAGNOSIS — M47814 Spondylosis without myelopathy or radiculopathy, thoracic region: Secondary | ICD-10-CM | POA: Diagnosis not present

## 2020-07-19 DIAGNOSIS — D649 Anemia, unspecified: Secondary | ICD-10-CM | POA: Diagnosis not present

## 2020-08-02 DIAGNOSIS — Z6820 Body mass index (BMI) 20.0-20.9, adult: Secondary | ICD-10-CM | POA: Diagnosis not present

## 2020-08-02 DIAGNOSIS — R768 Other specified abnormal immunological findings in serum: Secondary | ICD-10-CM | POA: Diagnosis not present

## 2020-08-02 DIAGNOSIS — M2550 Pain in unspecified joint: Secondary | ICD-10-CM | POA: Diagnosis not present

## 2020-08-02 DIAGNOSIS — M25532 Pain in left wrist: Secondary | ICD-10-CM | POA: Diagnosis not present

## 2020-08-19 DIAGNOSIS — D649 Anemia, unspecified: Secondary | ICD-10-CM | POA: Diagnosis not present

## 2020-09-15 DIAGNOSIS — Z9181 History of falling: Secondary | ICD-10-CM | POA: Diagnosis not present

## 2020-09-15 DIAGNOSIS — E559 Vitamin D deficiency, unspecified: Secondary | ICD-10-CM | POA: Diagnosis not present

## 2020-09-15 DIAGNOSIS — E063 Autoimmune thyroiditis: Secondary | ICD-10-CM | POA: Diagnosis not present

## 2020-09-15 DIAGNOSIS — Z6821 Body mass index (BMI) 21.0-21.9, adult: Secondary | ICD-10-CM | POA: Diagnosis not present

## 2020-09-15 DIAGNOSIS — E538 Deficiency of other specified B group vitamins: Secondary | ICD-10-CM | POA: Diagnosis not present

## 2020-09-15 DIAGNOSIS — D649 Anemia, unspecified: Secondary | ICD-10-CM | POA: Diagnosis not present

## 2020-09-15 DIAGNOSIS — Z79899 Other long term (current) drug therapy: Secondary | ICD-10-CM | POA: Diagnosis not present

## 2020-09-15 DIAGNOSIS — Z1331 Encounter for screening for depression: Secondary | ICD-10-CM | POA: Diagnosis not present

## 2020-09-15 DIAGNOSIS — N400 Enlarged prostate without lower urinary tract symptoms: Secondary | ICD-10-CM | POA: Diagnosis not present

## 2020-09-15 DIAGNOSIS — R739 Hyperglycemia, unspecified: Secondary | ICD-10-CM | POA: Diagnosis not present

## 2020-09-15 DIAGNOSIS — Z139 Encounter for screening, unspecified: Secondary | ICD-10-CM | POA: Diagnosis not present

## 2020-09-15 DIAGNOSIS — I4891 Unspecified atrial fibrillation: Secondary | ICD-10-CM | POA: Diagnosis not present

## 2020-09-27 DIAGNOSIS — R972 Elevated prostate specific antigen [PSA]: Secondary | ICD-10-CM | POA: Diagnosis not present

## 2020-10-19 DIAGNOSIS — H26492 Other secondary cataract, left eye: Secondary | ICD-10-CM | POA: Diagnosis not present

## 2020-10-19 DIAGNOSIS — H5201 Hypermetropia, right eye: Secondary | ICD-10-CM | POA: Diagnosis not present

## 2020-10-19 DIAGNOSIS — H5212 Myopia, left eye: Secondary | ICD-10-CM | POA: Diagnosis not present

## 2020-10-19 DIAGNOSIS — H35372 Puckering of macula, left eye: Secondary | ICD-10-CM | POA: Diagnosis not present

## 2020-10-19 DIAGNOSIS — H40013 Open angle with borderline findings, low risk, bilateral: Secondary | ICD-10-CM | POA: Diagnosis not present

## 2020-12-22 DIAGNOSIS — E063 Autoimmune thyroiditis: Secondary | ICD-10-CM | POA: Diagnosis not present

## 2020-12-22 DIAGNOSIS — E538 Deficiency of other specified B group vitamins: Secondary | ICD-10-CM | POA: Diagnosis not present

## 2020-12-22 DIAGNOSIS — Z6822 Body mass index (BMI) 22.0-22.9, adult: Secondary | ICD-10-CM | POA: Diagnosis not present

## 2020-12-22 DIAGNOSIS — Z125 Encounter for screening for malignant neoplasm of prostate: Secondary | ICD-10-CM | POA: Diagnosis not present

## 2020-12-22 DIAGNOSIS — D649 Anemia, unspecified: Secondary | ICD-10-CM | POA: Diagnosis not present

## 2020-12-22 DIAGNOSIS — N400 Enlarged prostate without lower urinary tract symptoms: Secondary | ICD-10-CM | POA: Diagnosis not present

## 2020-12-22 DIAGNOSIS — E663 Overweight: Secondary | ICD-10-CM | POA: Diagnosis not present

## 2020-12-22 DIAGNOSIS — Z23 Encounter for immunization: Secondary | ICD-10-CM | POA: Diagnosis not present

## 2020-12-22 DIAGNOSIS — R7303 Prediabetes: Secondary | ICD-10-CM | POA: Diagnosis not present

## 2021-02-08 DIAGNOSIS — E063 Autoimmune thyroiditis: Secondary | ICD-10-CM | POA: Diagnosis not present

## 2021-03-28 DIAGNOSIS — Z6822 Body mass index (BMI) 22.0-22.9, adult: Secondary | ICD-10-CM | POA: Diagnosis not present

## 2021-03-28 DIAGNOSIS — D649 Anemia, unspecified: Secondary | ICD-10-CM | POA: Diagnosis not present

## 2021-03-28 DIAGNOSIS — R7303 Prediabetes: Secondary | ICD-10-CM | POA: Diagnosis not present

## 2021-03-28 DIAGNOSIS — E063 Autoimmune thyroiditis: Secondary | ICD-10-CM | POA: Diagnosis not present

## 2021-03-28 DIAGNOSIS — E538 Deficiency of other specified B group vitamins: Secondary | ICD-10-CM | POA: Diagnosis not present

## 2021-03-28 DIAGNOSIS — N400 Enlarged prostate without lower urinary tract symptoms: Secondary | ICD-10-CM | POA: Diagnosis not present

## 2021-03-30 DIAGNOSIS — N401 Enlarged prostate with lower urinary tract symptoms: Secondary | ICD-10-CM | POA: Diagnosis not present

## 2021-03-30 DIAGNOSIS — R972 Elevated prostate specific antigen [PSA]: Secondary | ICD-10-CM | POA: Diagnosis not present

## 2021-04-13 DIAGNOSIS — D649 Anemia, unspecified: Secondary | ICD-10-CM | POA: Diagnosis not present

## 2021-04-20 DIAGNOSIS — H26493 Other secondary cataract, bilateral: Secondary | ICD-10-CM | POA: Diagnosis not present

## 2021-04-20 DIAGNOSIS — H5212 Myopia, left eye: Secondary | ICD-10-CM | POA: Diagnosis not present

## 2021-04-20 DIAGNOSIS — H35373 Puckering of macula, bilateral: Secondary | ICD-10-CM | POA: Diagnosis not present

## 2021-04-20 DIAGNOSIS — H40013 Open angle with borderline findings, low risk, bilateral: Secondary | ICD-10-CM | POA: Diagnosis not present

## 2021-04-20 DIAGNOSIS — H5201 Hypermetropia, right eye: Secondary | ICD-10-CM | POA: Diagnosis not present

## 2021-05-02 DIAGNOSIS — D649 Anemia, unspecified: Secondary | ICD-10-CM | POA: Diagnosis not present

## 2021-05-10 ENCOUNTER — Encounter: Payer: Self-pay | Admitting: Cardiology

## 2021-05-10 ENCOUNTER — Ambulatory Visit: Payer: Medicare HMO | Admitting: Cardiology

## 2021-05-10 VITALS — BP 124/74 | HR 69 | Ht 72.0 in | Wt 163.4 lb

## 2021-05-10 DIAGNOSIS — Z7901 Long term (current) use of anticoagulants: Secondary | ICD-10-CM

## 2021-05-10 DIAGNOSIS — I420 Dilated cardiomyopathy: Secondary | ICD-10-CM | POA: Diagnosis not present

## 2021-05-10 DIAGNOSIS — I480 Paroxysmal atrial fibrillation: Secondary | ICD-10-CM

## 2021-05-10 NOTE — Progress Notes (Signed)
?Cardiology Office Note:   ? ?Date:  05/10/2021  ? ?ID:  Jonathan Patterson, DOB [birth year], MRN [id number] ? ?PCP:  Jim Like, NP  ?Cardiologist:  Norman Herrlich, MD   ? ?Referring MD: Gordy Councilman, FNP  ? ? ?ASSESSMENT:   ? ?1. PAF (paroxysmal atrial fibrillation) (HCC)   ?2. Chronic anticoagulation   ?3. Dilated cardiomyopathy (HCC)   ? ?PLAN:   ? ?In order of problems listed above: ? ?[name] continues to do well maintaining sinus rhythm on low-dose beta-blocker continue his anticoagulant. ?Stable we will continue his ACE inhibitor beta-blocker and intermittent diuretic heart failure is compensated and EF is recovered ?No recurrent syncope standing blood pressure today does not shift.  His event monitor is reassuring ? ? ?Next appointment: 6 months ? ? ?Medication Adjustments/Labs and Tests Ordered: ?Current medicines are reviewed at length with the patient today.  Concerns regarding medicines are outlined above.  ?No orders of the defined types were placed in this encounter. ? ?No orders of the defined types were placed in this encounter. ? ? ?Chief Complaint  ?Patient presents with  ? Follow-up  ? Hypotension  ? ? ?History of Present Illness:   ? ?Jonathan Patterson is a 76 y.o. male with a hx of syncope paroxysmal atrial fibrillation anticoagulated with Eliquis with chronic anticoagulation cardiomyopathy with normalization of ejection fraction on echocardiogram and mild CAD with a calcium score of 5 last seen 04/15/2020 with symptomatic orthostatic hypotension and his diuretic was transitioned to as needed.He was at [facility]/18/2020 and discharged the next day with a discharge diagnosis of syncope.  The episode was witnessed and his wife describes seizure-like activity.  When he was evaluated in the emergency room he had normal vital signs and no orthostatic shift.  An extensive evaluation revealed a  normal cardiac troponin proBNP level was not elevated chest x-ray was clear.  He was advised after  discharge to wear ambulatory event monitor.  ? ?Compliance with diet, lifestyle and medications: Yes ? 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 ?He has no lightheadedness and takes his diuretic 3 days a week and he cannot reduce it further because of edema ?He has no shortness of breath orthopnea chest pain palpitations. ?He has had no bleeding with his anticoagulant.  ?Recent labs 03/28/2021 ?Hemoglobin 12.9 creatinine 0.69 potassium 4.3 cholesterol 169 LDL 100 ? ?A 14-day monitor was performed and reported 03/30/2020 showing occasional ventricular ectopy and episodes of brief atrial tachycardia the longest 13 seconds at a rate of 99 bpm. ?Past Medical History:  ?Diagnosis Date  ? Acute kidney injury (HCC)   ? Arrhythmia   ? Atrial fibrillation with RVR (HCC)   ? Cardiomyopathy (HCC)   ? Congestive heart failure (CHF) (HCC)   ? Diverticulitis   ? Elevated liver enzymes   ? Hypotension   ? Hypothyroid   ? Iron deficiency anemia   ? Mitral regurgitation   ? ? ?Past Surgical History:  ?Procedure Laterality Date  ? CATARACT EXTRACTION Bilateral   ? POLYPECTOMY  07/2019  ? ? ?Current Medications: ?Current Meds  ?Medication Sig  ? Calcium Carb-Cholecalciferol 600-20 MG-MCG TABS Take 1 tablet by mouth daily.  ? Cyanocobalamin (VITAMIN B12 TR PO) Take 2,500 mcg by mouth daily.  ? ELIQUIS 5 MG TABS tablet Take 5 mg by mouth 2 (two) times daily.  ? ferrous sulfate 324 MG TBEC Take 324 mg by mouth daily with breakfast.  ? finasteride (PROSCAR) 5  MG tablet Take 5 mg by mouth daily.  ? furosemide (LASIX) 40 MG tablet Take 1 tablet by mouth daily.  ? levothyroxine (SYNTHROID) 137 MCG tablet Take 137 mcg by mouth daily.  ? lisinopril (ZESTRIL) 2.5 MG tablet Take 2.5 mg by mouth daily.  ? metoprolol tartrate (LOPRESSOR) 25 MG tablet Take 25 mg by mouth 2 (two) times daily.  ? tamsulosin (FLOMAX) 0.4 MG CAPS capsule Take 1 capsule by mouth daily.  ?  ? ?Allergies:   Patient has no known allergies.  ? ?Social History   ? ?Socioeconomic History  ? Marital status: Unknown  ?  Spouse name: Not on file  ? Number of children: Not on file  ? Years of education: Not on file  ? Highest education level: Not on file  ?Occupational History  ? Not on file  ?Tobacco Use  ? Smoking status: Former  ?  Packs/day: 3.00  ?  Types: Cigarettes  ?  Quit date: 1998  ?  Years since quitting: 25.2  ?  Passive exposure: Past  ? Smokeless tobacco: Never  ?Vaping Use  ? Vaping Use: Never used  ?Substance and Sexual Activity  ? Alcohol use: Not Currently  ? Drug use: Not Currently  ? Sexual activity: Not on file  ?Other Topics Concern  ? Not on file  ?Social History Narrative  ? Not on file  ? ?Social Determinants of Health  ? ?Financial Resource Strain: Not on file  ?Food Insecurity: Not on file  ?Transportation Needs: Not on file  ?Physical Activity: Not on file  ?Stress: Not on file  ?Social Connections: Not on file  ?  ? ?Family History: ?The patient's family history includes Asthma in his daughter; Cancer in his mother; Diabetes in his father and sister. ?ROS:   ?Please see the history of present illness.    ?All other systems reviewed and are negative. ? ?EKGs/Labs/Other Studies Reviewed:   ? ?The following studies were reviewed today: ? ?EKG:  EKG ordered today and personally reviewed.  The ekg ordered today demonstrates sinus rhythm 69 bpm normal EKG ? ? ?Physical Exam:   ? ?VS:  BP 124/74 (BP Location: Left Arm)   Pulse 69   Ht 6' (1.829 m)   Wt 163 lb 6.4 oz (74.1 kg)   SpO2 97%   BMI 22.16 kg/m?    ? ?Wt Readings from Last 3 Encounters:  ?05/10/21 163 lb 6.4 oz (74.1 kg)  ?04/15/20 166 lb 3.2 oz (75.4 kg)  ?03/04/20 159 lb (72.1 kg)  ?  ? ?GEN:  Well nourished, well developed in no acute distress ?HEENT: Normal ?NECK: No JVD; No carotid bruits ?LYMPHATICS: No lymphadenopathy ?CARDIAC: RRR, no murmurs, rubs, gallops ?RESPIRATORY:  Clear to auscultation without rales, wheezing or rhonchi  ?ABDOMEN: Soft, non-tender,  non-distended ?MUSCULOSKELETAL:  No edema; No deformity  ?SKIN: Warm and dry ?NEUROLOGIC:  Alert and oriented x 3 ?PSYCHIATRIC:  Normal affect  ? ? ?Signed, ?Norman Herrlich, MD  ?05/10/2021 4:18 PM    ?[facility]  ?

## 2021-05-10 NOTE — Patient Instructions (Addendum)
Medication Instructions:  ?Your physician recommends that you continue on your current medications as directed. Please refer to the Current Medication list given to you today. ? ?*If you need a refill on your cardiac medications before your next appointment, please call your pharmacy* ? ? ?Lab Work: ?NONE ?If you have labs (blood work) drawn today and your tests are completely normal, you will receive your results only by: ?MyChart Message (if you have MyChart) OR ?A paper copy in the mail ?If you have any lab test that is abnormal or we need to change your treatment, we will call you to review the results. ? ? ?Testing/Procedures: ?NONE ? ? ?Follow-Up: ?At [facility], you and your health needs are our priority.  As part of our continuing mission to provide you with exceptional heart care, we have created designated Provider Care Teams.  These Care Teams include your primary Cardiologist (physician) and Advanced Practice Providers (APPs -  Physician Assistants and Nurse Practitioners) who all work together to provide you with the care you need, when you need it. ? ?We recommend signing up for the patient portal called "MyChart".  Sign up information is provided on this After Visit Summary.  MyChart is used to connect with patients for Virtual Visits (Telemedicine).  Patients are able to view lab/test results, encounter notes, upcoming appointments, etc.  Non-urgent messages can be sent to your provider as well.   ?To learn more about what you can do with MyChart, go to [email or web address].   ? ?Your next appointment:   ?6 month(s) ? ?The format for your next appointment:   ?In Person ? ?Provider:   ?Shirlee More, MD  ? ? ?Other Instructions ?Check BP at home several times per day and record. ?  ?

## 2021-05-12 DIAGNOSIS — H35373 Puckering of macula, bilateral: Secondary | ICD-10-CM | POA: Diagnosis not present

## 2021-05-12 DIAGNOSIS — H40013 Open angle with borderline findings, low risk, bilateral: Secondary | ICD-10-CM | POA: Diagnosis not present

## 2021-05-12 DIAGNOSIS — H26492 Other secondary cataract, left eye: Secondary | ICD-10-CM | POA: Diagnosis not present

## 2021-05-12 DIAGNOSIS — H26493 Other secondary cataract, bilateral: Secondary | ICD-10-CM | POA: Diagnosis not present

## 2021-05-12 DIAGNOSIS — Z961 Presence of intraocular lens: Secondary | ICD-10-CM | POA: Diagnosis not present

## 2021-05-19 DIAGNOSIS — H5212 Myopia, left eye: Secondary | ICD-10-CM | POA: Diagnosis not present

## 2021-05-19 DIAGNOSIS — H35373 Puckering of macula, bilateral: Secondary | ICD-10-CM | POA: Diagnosis not present

## 2021-05-19 DIAGNOSIS — H26492 Other secondary cataract, left eye: Secondary | ICD-10-CM | POA: Diagnosis not present

## 2021-05-19 DIAGNOSIS — H40013 Open angle with borderline findings, low risk, bilateral: Secondary | ICD-10-CM | POA: Diagnosis not present

## 2021-05-19 DIAGNOSIS — H5201 Hypermetropia, right eye: Secondary | ICD-10-CM | POA: Diagnosis not present

## 2021-05-19 DIAGNOSIS — H26493 Other secondary cataract, bilateral: Secondary | ICD-10-CM | POA: Diagnosis not present

## 2021-05-26 DIAGNOSIS — H5201 Hypermetropia, right eye: Secondary | ICD-10-CM | POA: Diagnosis not present

## 2021-05-26 DIAGNOSIS — H40013 Open angle with borderline findings, low risk, bilateral: Secondary | ICD-10-CM | POA: Diagnosis not present

## 2021-05-26 DIAGNOSIS — H5212 Myopia, left eye: Secondary | ICD-10-CM | POA: Diagnosis not present

## 2021-05-26 DIAGNOSIS — H35373 Puckering of macula, bilateral: Secondary | ICD-10-CM | POA: Diagnosis not present

## 2021-05-26 DIAGNOSIS — H26491 Other secondary cataract, right eye: Secondary | ICD-10-CM | POA: Diagnosis not present

## 2021-05-26 DIAGNOSIS — H26493 Other secondary cataract, bilateral: Secondary | ICD-10-CM | POA: Diagnosis not present

## 2021-06-01 DIAGNOSIS — H524 Presbyopia: Secondary | ICD-10-CM | POA: Diagnosis not present

## 2021-06-27 DIAGNOSIS — E559 Vitamin D deficiency, unspecified: Secondary | ICD-10-CM | POA: Diagnosis not present

## 2021-06-27 DIAGNOSIS — E063 Autoimmune thyroiditis: Secondary | ICD-10-CM | POA: Diagnosis not present

## 2021-06-27 DIAGNOSIS — Z6823 Body mass index (BMI) 23.0-23.9, adult: Secondary | ICD-10-CM | POA: Diagnosis not present

## 2021-06-27 DIAGNOSIS — N400 Enlarged prostate without lower urinary tract symptoms: Secondary | ICD-10-CM | POA: Diagnosis not present

## 2021-06-27 DIAGNOSIS — D649 Anemia, unspecified: Secondary | ICD-10-CM | POA: Diagnosis not present

## 2021-06-27 DIAGNOSIS — I5030 Unspecified diastolic (congestive) heart failure: Secondary | ICD-10-CM | POA: Diagnosis not present

## 2021-06-27 DIAGNOSIS — R7303 Prediabetes: Secondary | ICD-10-CM | POA: Diagnosis not present

## 2021-06-27 DIAGNOSIS — E538 Deficiency of other specified B group vitamins: Secondary | ICD-10-CM | POA: Diagnosis not present

## 2021-06-27 DIAGNOSIS — I4891 Unspecified atrial fibrillation: Secondary | ICD-10-CM | POA: Diagnosis not present

## 2021-09-27 DIAGNOSIS — N402 Nodular prostate without lower urinary tract symptoms: Secondary | ICD-10-CM | POA: Diagnosis not present

## 2021-09-27 DIAGNOSIS — R972 Elevated prostate specific antigen [PSA]: Secondary | ICD-10-CM | POA: Diagnosis not present

## 2021-09-27 DIAGNOSIS — N401 Enlarged prostate with lower urinary tract symptoms: Secondary | ICD-10-CM | POA: Diagnosis not present

## 2021-09-28 DIAGNOSIS — E063 Autoimmune thyroiditis: Secondary | ICD-10-CM | POA: Diagnosis not present

## 2021-09-28 DIAGNOSIS — R7303 Prediabetes: Secondary | ICD-10-CM | POA: Diagnosis not present

## 2021-09-28 DIAGNOSIS — I4891 Unspecified atrial fibrillation: Secondary | ICD-10-CM | POA: Diagnosis not present

## 2021-09-28 DIAGNOSIS — D649 Anemia, unspecified: Secondary | ICD-10-CM | POA: Diagnosis not present

## 2021-09-28 DIAGNOSIS — N400 Enlarged prostate without lower urinary tract symptoms: Secondary | ICD-10-CM | POA: Diagnosis not present

## 2021-09-28 DIAGNOSIS — Z6823 Body mass index (BMI) 23.0-23.9, adult: Secondary | ICD-10-CM | POA: Diagnosis not present

## 2021-09-28 DIAGNOSIS — Z139 Encounter for screening, unspecified: Secondary | ICD-10-CM | POA: Diagnosis not present

## 2021-09-28 DIAGNOSIS — Z1331 Encounter for screening for depression: Secondary | ICD-10-CM | POA: Diagnosis not present

## 2021-09-28 DIAGNOSIS — Z9181 History of falling: Secondary | ICD-10-CM | POA: Diagnosis not present

## 2021-09-28 DIAGNOSIS — I5030 Unspecified diastolic (congestive) heart failure: Secondary | ICD-10-CM | POA: Diagnosis not present

## 2021-11-24 ENCOUNTER — Ambulatory Visit: Payer: Medicare HMO | Attending: Cardiology | Admitting: Cardiology

## 2021-11-24 VITALS — BP 108/58 | HR 72 | Ht 72.0 in | Wt 173.2 lb

## 2021-11-24 DIAGNOSIS — I420 Dilated cardiomyopathy: Secondary | ICD-10-CM | POA: Diagnosis not present

## 2021-11-24 DIAGNOSIS — I5042 Chronic combined systolic (congestive) and diastolic (congestive) heart failure: Secondary | ICD-10-CM

## 2021-11-24 DIAGNOSIS — R768 Other specified abnormal immunological findings in serum: Secondary | ICD-10-CM | POA: Insufficient documentation

## 2021-11-24 DIAGNOSIS — Z7901 Long term (current) use of anticoagulants: Secondary | ICD-10-CM

## 2021-11-24 DIAGNOSIS — I951 Orthostatic hypotension: Secondary | ICD-10-CM | POA: Diagnosis not present

## 2021-11-24 DIAGNOSIS — I480 Paroxysmal atrial fibrillation: Secondary | ICD-10-CM | POA: Diagnosis not present

## 2021-11-24 HISTORY — DX: Other specified abnormal immunological findings in serum: R76.8

## 2021-11-24 NOTE — Patient Instructions (Signed)
Medication Instructions:  Your physician has recommended you make the following change in your medication:  Take Iron every other day Take Furosemide on Monday and Friday  *If you need a refill on your cardiac medications before your next appointment, please call your pharmacy*   Lab Work: NONE If you have labs (blood work) drawn today and your tests are completely normal, you will receive your results only by: [place] (if you have MyChart) OR A paper copy in the mail If you have any lab test that is abnormal or we need to change your treatment, we will call you to review the results.   Testing/Procedures: NONE   Follow-Up: At [facility], you and your health needs are our priority.  As part of our continuing mission to provide you with exceptional heart care, we have created designated Provider Care Teams.  These Care Teams include your primary Cardiologist (physician) and Advanced Practice Providers (APPs -  Physician Assistants and Nurse Practitioners) who all work together to provide you with the care you need, when you need it.  We recommend signing up for the patient portal called "MyChart".  Sign up information is provided on this After Visit Summary.  MyChart is used to connect with patients for Virtual Visits (Telemedicine).  Patients are able to view lab/test results, encounter notes, upcoming appointments, etc.  Non-urgent messages can be sent to your provider as well.   To learn more about what you can do with MyChart, go to [email or web address].    Your next appointment:   9 month(s)  The format for your next appointment:   In Person  Provider:   Shirlee More, MD    Other Instructions   Important Information About Sugar

## 2021-11-24 NOTE — Progress Notes (Signed)
Cardiology Office Note:    Date:  11/24/2021   ID:  Jonathan Patterson, DOB [birth year], MRN [id number]  PCP:  Jim Like, NP  Cardiologist:  Norman Herrlich, MD    Referring MD: Jim Like, NP    ASSESSMENT:    1. Dilated cardiomyopathy (HCC)   2. Chronic combined systolic and diastolic heart failure (HCC)   3. PAF (paroxysmal atrial fibrillation) (HCC)   4. Chronic anticoagulation   5. Orthostatic hypotension    PLAN:    In order of problems listed above:  Jonathan Patterson is done well with control of atrial fibrillation maintaining sinus rhythm his ejection fraction is normalized heart failure is compensated I will have him reduce his loop diuretic to 2 days a week may discontinue next visit and continue as background therapy beta-blocker ACE inhibitor. He has had no recurrent atrial fibrillation remains anticoagulated No recurrent symptomatic orthostatic hypotension   Next appointment: I will plan on seeing him in 1 year   Medication Adjustments/Labs and Tests Ordered: Current medicines are reviewed at length with the patient today.  Concerns regarding medicines are outlined above.  No orders of the defined types were placed in this encounter.  No orders of the defined types were placed in this encounter.   Chief Complaint  Patient presents with   Follow-up   Cardiomyopathy   Congestive Heart Failure   Hypotension    History of Present Illness:    Jonathan Patterson is a 76 y.o. male with a hx of syncope paroxysmal atrial fibrillation anticoagulated with Eliquis with chronic anticoagulation cardiomyopathy with normalization of ejection fraction on echocardiogram and mild CAD with a calcium score of 5 last seen 04/15/2020 with symptomatic orthostatic hypotension and his diuretic was transitioned to as needed.He was at [facility] 01/18/2020 and discharged the next day with a discharge diagnosis of syncope.  The episode was witnessed and his wife describes seizure-like activity.   When he was evaluated in the emergency room he had normal vital signs and no orthostatic shift.  An extensive evaluation revealed a  normal cardiac troponin proBNP level was not elevated chest x-ray was clear. He was last seen 05/10/2021.  Compliance with diet, lifestyle and medications: Yes  [name] continues to do well he said no further orthostatic symptoms or syncope He has not been checking his home blood pressure and said he would several times a week good technique validated device would record and bring to the office next visit He continues to work full-time.  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   Cyanocobalamin (VITAMIN B12 TR  PO) Take 2,500 mcg by mouth daily.   ELIQUIS 5 MG TABS tablet Take 5 mg by mouth 2 (two) times daily.   ferrous sulfate 324 MG TBEC Take 324 mg by mouth daily with breakfast.   finasteride (PROSCAR) 5 MG tablet Take 5 mg by mouth daily.   furosemide (LASIX) 40 MG tablet Take 1 tablet by mouth daily.   levothyroxine (SYNTHROID) 137 MCG tablet Take 137 mcg by mouth daily.   lisinopril (ZESTRIL) 2.5 MG tablet Take 2.5 mg by mouth daily.   metoprolol  tartrate (LOPRESSOR) 25 MG tablet Take 25 mg by mouth 2 (two) times daily.   tamsulosin (FLOMAX) 0.4 MG CAPS capsule Take 1 capsule by mouth daily.     Allergies:   Patient has no known allergies.   Social History   Socioeconomic History   Marital status: Unknown    Spouse name: Not on file   Number of children: Not on file   Years of education: Not on file   Highest education level: Not on file  Occupational History   Not on file  Tobacco Use   Smoking status: Former    Packs/day: 3.00    Types: Cigarettes    Quit date: 19    Years since quitting: 25.8    Passive exposure: Past   Smokeless tobacco: Never  Vaping Use   Vaping Use: Never used  Substance and Sexual Activity   Alcohol use: Not Currently   Drug use: Not Currently   Sexual activity: Not on file  Other Topics Concern   Not on file  Social History Narrative   Not on file   Social Determinants of Health   Financial Resource Strain: Not on file  Food Insecurity: Not on file  Transportation Needs: Not on file  Physical Activity: Not on file  Stress: Not on file  Social Connections: Not on file     Family History: The patient's family history includes Asthma in his daughter; Cancer in his mother; Diabetes in his father and sister. ROS:   Please see the history of present illness.    All other systems reviewed and are negative.  EKGs/Labs/Other Studies Reviewed:    The following studies were reviewed today:  A 14-day monitor was performed and reported 03/30/2020 showing occasional ventricular ectopy and episodes of brief atrial tachycardia the longest 13 seconds at a rate of 99 bpm.  Recent Labs: 09/28/2021 very reassuring labs with his PCP cholesterol 198 triglycerides 98 A1c 5.8% hemoglobin 13.1 creatinine 0.78 potassium 4.2 Physical Exam:    VS:  BP (!) 108/58   Pulse 72   Ht 6' (1.829 m)   Wt 173 lb 3.2 oz (78.6 kg)   SpO2 95%   BMI 23.49 kg/m     Wt Readings from Last 3 Encounters:   11/24/21 173 lb 3.2 oz (78.6 kg)  05/10/21 163 lb 6.4 oz (74.1 kg)  04/15/20 166 lb 3.2 oz (75.4 kg)     GEN:  Well nourished, well developed in no acute distress HEENT: Normal NECK: No JVD; No carotid bruits LYMPHATICS: No lymphadenopathy CARDIAC: RRR, no murmurs, rubs, gallops RESPIRATORY:  Clear to auscultation without rales, wheezing or rhonchi  ABDOMEN: Soft, non-tender, non-distended MUSCULOSKELETAL:  No edema; No deformity  SKIN: Warm and dry NEUROLOGIC:  Alert and oriented x 3 PSYCHIATRIC:  Normal affect    Signed, Shirlee More, MD  11/24/2021 4:06 PM    [facility]

## 2022-01-02 DIAGNOSIS — E559 Vitamin D deficiency, unspecified: Secondary | ICD-10-CM | POA: Diagnosis not present

## 2022-01-02 DIAGNOSIS — Z6824 Body mass index (BMI) 24.0-24.9, adult: Secondary | ICD-10-CM | POA: Diagnosis not present

## 2022-01-02 DIAGNOSIS — L237 Allergic contact dermatitis due to plants, except food: Secondary | ICD-10-CM | POA: Diagnosis not present

## 2022-01-02 DIAGNOSIS — E063 Autoimmune thyroiditis: Secondary | ICD-10-CM | POA: Diagnosis not present

## 2022-01-02 DIAGNOSIS — Z125 Encounter for screening for malignant neoplasm of prostate: Secondary | ICD-10-CM | POA: Diagnosis not present

## 2022-01-02 DIAGNOSIS — D649 Anemia, unspecified: Secondary | ICD-10-CM | POA: Diagnosis not present

## 2022-01-02 DIAGNOSIS — R7303 Prediabetes: Secondary | ICD-10-CM | POA: Diagnosis not present

## 2022-01-02 DIAGNOSIS — I5030 Unspecified diastolic (congestive) heart failure: Secondary | ICD-10-CM | POA: Diagnosis not present

## 2022-01-02 DIAGNOSIS — E538 Deficiency of other specified B group vitamins: Secondary | ICD-10-CM | POA: Diagnosis not present

## 2022-01-02 DIAGNOSIS — Z23 Encounter for immunization: Secondary | ICD-10-CM | POA: Diagnosis not present

## 2022-01-02 DIAGNOSIS — I4891 Unspecified atrial fibrillation: Secondary | ICD-10-CM | POA: Diagnosis not present

## 2022-01-27 DIAGNOSIS — D649 Anemia, unspecified: Secondary | ICD-10-CM | POA: Diagnosis not present

## 2022-03-31 DIAGNOSIS — N401 Enlarged prostate with lower urinary tract symptoms: Secondary | ICD-10-CM | POA: Diagnosis not present

## 2022-03-31 DIAGNOSIS — R972 Elevated prostate specific antigen [PSA]: Secondary | ICD-10-CM | POA: Diagnosis not present

## 2022-03-31 DIAGNOSIS — Z79899 Other long term (current) drug therapy: Secondary | ICD-10-CM | POA: Diagnosis not present

## 2022-04-10 DIAGNOSIS — I4891 Unspecified atrial fibrillation: Secondary | ICD-10-CM | POA: Diagnosis not present

## 2022-04-10 DIAGNOSIS — Z6825 Body mass index (BMI) 25.0-25.9, adult: Secondary | ICD-10-CM | POA: Diagnosis not present

## 2022-04-10 DIAGNOSIS — E538 Deficiency of other specified B group vitamins: Secondary | ICD-10-CM | POA: Diagnosis not present

## 2022-04-10 DIAGNOSIS — E559 Vitamin D deficiency, unspecified: Secondary | ICD-10-CM | POA: Diagnosis not present

## 2022-04-10 DIAGNOSIS — R7303 Prediabetes: Secondary | ICD-10-CM | POA: Diagnosis not present

## 2022-04-10 DIAGNOSIS — E063 Autoimmune thyroiditis: Secondary | ICD-10-CM | POA: Diagnosis not present

## 2022-04-10 DIAGNOSIS — D649 Anemia, unspecified: Secondary | ICD-10-CM | POA: Diagnosis not present

## 2022-04-10 DIAGNOSIS — I5030 Unspecified diastolic (congestive) heart failure: Secondary | ICD-10-CM | POA: Diagnosis not present

## 2022-06-13 DIAGNOSIS — L089 Local infection of the skin and subcutaneous tissue, unspecified: Secondary | ICD-10-CM | POA: Diagnosis not present

## 2022-06-13 DIAGNOSIS — M79642 Pain in left hand: Secondary | ICD-10-CM | POA: Diagnosis not present

## 2022-07-11 DIAGNOSIS — E063 Autoimmune thyroiditis: Secondary | ICD-10-CM | POA: Diagnosis not present

## 2022-07-11 DIAGNOSIS — E538 Deficiency of other specified B group vitamins: Secondary | ICD-10-CM | POA: Diagnosis not present

## 2022-07-11 DIAGNOSIS — R7303 Prediabetes: Secondary | ICD-10-CM | POA: Diagnosis not present

## 2022-07-11 DIAGNOSIS — E559 Vitamin D deficiency, unspecified: Secondary | ICD-10-CM | POA: Diagnosis not present

## 2022-09-12 DIAGNOSIS — H26491 Other secondary cataract, right eye: Secondary | ICD-10-CM | POA: Diagnosis not present

## 2022-09-12 DIAGNOSIS — H524 Presbyopia: Secondary | ICD-10-CM | POA: Diagnosis not present

## 2022-09-12 DIAGNOSIS — H35373 Puckering of macula, bilateral: Secondary | ICD-10-CM | POA: Diagnosis not present

## 2022-09-12 DIAGNOSIS — H40013 Open angle with borderline findings, low risk, bilateral: Secondary | ICD-10-CM | POA: Diagnosis not present

## 2022-09-20 DIAGNOSIS — M2550 Pain in unspecified joint: Secondary | ICD-10-CM

## 2022-09-20 HISTORY — DX: Pain in unspecified joint: M25.50

## 2022-09-21 NOTE — Progress Notes (Signed)
Cardiology Office Note:    Date:  09/22/2022   ID:  Jonathan Patterson, DOB 1945/05/27, MRN [id number]  PCP:  Jim Like, NP  Cardiologist:  Norman Herrlich, MD    Referring MD: Jim Like, NP    ASSESSMENT:    1. Dilated cardiomyopathy (HCC)   2. Chronic diastolic heart failure (HCC)   3. PAF (paroxysmal atrial fibrillation) (HCC)   4. Chronic anticoagulation    PLAN:    In order of problems listed above:  [place] continues to do well no clinical recurrence of atrial fibrillation normalization of ejection fraction and compensated heart failure Continue his anticoagulant with a history of atrial fibrillation and tachycardia induced cardiomyopathy Blood pressure is well-controlled now without orthostatic hypotension Continue to follow labs in his PCP office and I will plan to see back in 1 year.   Next appointment: 1 year   Medication Adjustments/Labs and Tests Ordered: Current medicines are reviewed at length with the patient today.  Concerns regarding medicines are outlined above.  Orders Placed This Encounter  Procedures   EKG 12-Lead   No orders of the defined types were placed in this encounter.    History of Present Illness:    Jonathan Patterson is a 77 y.o. male with a hx of Cardiomyopathy with normalization of ejection fraction heart failure paroxysmal atrial fibrillation chronic anticoagulation and previous orthostatic hypotension last seen 11/24/2021.  Compliance with diet, lifestyle and medications: Yes  He continues to work on less than full-time basis pleased with the quality of his life has had no clinical recurrence of atrial fibrillation no bleeding complication of his anticoagulant and no cardiovascular symptoms of edema shortness of breath chest pain palpitations syncope or orthostatic lightheadedness.  Labs are followed in his PCP office. 07/11/2022 cholesterol 185 LDL 117 A1c 6.0 hemoglobin 13.2 creatinine 0.72 potassium 4.0 Past Medical History:   Diagnosis Date   Acute kidney injury (HCC)    Arrhythmia    Atrial fibrillation with RVR (HCC)    Cardiomyopathy (HCC)    Cataract, right eye 08/28/2013   Congestive heart failure (CHF) (HCC)    Diverticulitis    Elevated liver enzymes    Hypotension    Hypothyroid    Iron deficiency anemia    Mitral regurgitation    Nuclear sclerotic cataract of left eye 01/21/2017   Polyarthralgia 09/20/2022   Rheumatoid factor positive 11/24/2021    Current Medications: Current Meds  Medication Sig   Calcium Carb-Cholecalciferol 600-20 MG-MCG TABS Take 1 tablet by mouth daily.   Cyanocobalamin (VITAMIN B12 TR PO) Take 2,500 mcg by mouth daily.   ELIQUIS 5 MG TABS tablet Take 5 mg by mouth 2 (two) times daily.   ferrous sulfate 324 MG TBEC Take 324 mg by mouth daily with breakfast.   finasteride (PROSCAR) 5 MG tablet Take 5 mg by mouth daily.   furosemide (LASIX) 40 MG tablet Take 40 mg by mouth 2 (two) times a week. Monday and Friday   levothyroxine (SYNTHROID) 137 MCG tablet Take 137 mcg by mouth daily.   lisinopril (ZESTRIL) 2.5 MG tablet Take 2.5 mg by mouth daily.   metoprolol tartrate (LOPRESSOR) 25 MG tablet Take 25 mg by mouth 2 (two) times daily.   tamsulosin (FLOMAX) 0.4 MG CAPS capsule Take 1 capsule by mouth daily.      EKGs/Labs/Other Studies Reviewed:    The following studies were reviewed today:     EKG Interpretation Date/Time:  Friday September 22 2022 08:21:43 EDT Ventricular  Rate:  59 PR Interval:  194 QRS Duration:  90 QT Interval:  400 QTC Calculation: 396 R Axis:   6  Text Interpretation: Sinus bradycardia with sinus arrhythmia with occasional Premature ventricular complexes No previous ECGs available Confirmed by Norman Herrlich ([id number]) on 09/22/2022 8:32:55 AM     VS:  BP 124/60 (BP Location: Right Arm, Patient Position: Sitting, Cuff Size: Normal)   Pulse (!) 59   Ht 6' (1.829 m)   Wt 177 lb 3.2 oz (80.4 kg)   SpO2 96%   BMI 24.03 kg/m     Wt  Readings from Last 3 Encounters:  09/22/22 177 lb 3.2 oz (80.4 kg)  11/24/21 173 lb 3.2 oz (78.6 kg)  05/10/21 163 lb 6.4 oz (74.1 kg)     GEN:  Well nourished, well developed in no acute distress HEENT: Normal NECK: No JVD; No carotid bruits LYMPHATICS: No lymphadenopathy CARDIAC: RRR, no murmurs, rubs, gallops RESPIRATORY:  Clear to auscultation without rales, wheezing or rhonchi  ABDOMEN: Soft, non-tender, non-distended MUSCULOSKELETAL:  No edema; No deformity  SKIN: Warm and dry NEUROLOGIC:  Alert and oriented x 3 PSYCHIATRIC:  Normal affect    Signed, Norman Herrlich, MD  09/22/2022 8:43 AM    [facility]

## 2022-09-22 ENCOUNTER — Ambulatory Visit: Payer: Medicare HMO | Attending: Cardiology | Admitting: Cardiology

## 2022-09-22 ENCOUNTER — Encounter: Payer: Self-pay | Admitting: Cardiology

## 2022-09-22 VITALS — BP 124/60 | HR 59 | Ht 72.0 in | Wt 177.2 lb

## 2022-09-22 DIAGNOSIS — I480 Paroxysmal atrial fibrillation: Secondary | ICD-10-CM | POA: Diagnosis not present

## 2022-09-22 DIAGNOSIS — Z7901 Long term (current) use of anticoagulants: Secondary | ICD-10-CM

## 2022-09-22 DIAGNOSIS — I5032 Chronic diastolic (congestive) heart failure: Secondary | ICD-10-CM

## 2022-09-22 DIAGNOSIS — I420 Dilated cardiomyopathy: Secondary | ICD-10-CM

## 2022-09-22 NOTE — Patient Instructions (Signed)

## 2022-10-12 DIAGNOSIS — I5030 Unspecified diastolic (congestive) heart failure: Secondary | ICD-10-CM | POA: Diagnosis not present

## 2022-10-12 DIAGNOSIS — R7303 Prediabetes: Secondary | ICD-10-CM | POA: Diagnosis not present

## 2022-10-12 DIAGNOSIS — I4891 Unspecified atrial fibrillation: Secondary | ICD-10-CM | POA: Diagnosis not present

## 2022-10-12 DIAGNOSIS — E559 Vitamin D deficiency, unspecified: Secondary | ICD-10-CM | POA: Diagnosis not present

## 2022-10-12 DIAGNOSIS — Z1331 Encounter for screening for depression: Secondary | ICD-10-CM | POA: Diagnosis not present

## 2022-10-12 DIAGNOSIS — E538 Deficiency of other specified B group vitamins: Secondary | ICD-10-CM | POA: Diagnosis not present

## 2022-10-12 DIAGNOSIS — E063 Autoimmune thyroiditis: Secondary | ICD-10-CM | POA: Diagnosis not present

## 2022-10-12 DIAGNOSIS — Z9181 History of falling: Secondary | ICD-10-CM | POA: Diagnosis not present

## 2022-10-13 DIAGNOSIS — R339 Retention of urine, unspecified: Secondary | ICD-10-CM | POA: Diagnosis not present

## 2022-10-13 DIAGNOSIS — N400 Enlarged prostate without lower urinary tract symptoms: Secondary | ICD-10-CM | POA: Diagnosis not present

## 2022-10-13 DIAGNOSIS — R972 Elevated prostate specific antigen [PSA]: Secondary | ICD-10-CM | POA: Diagnosis not present

## 2022-10-13 DIAGNOSIS — N402 Nodular prostate without lower urinary tract symptoms: Secondary | ICD-10-CM | POA: Diagnosis not present

## 2022-10-13 DIAGNOSIS — N401 Enlarged prostate with lower urinary tract symptoms: Secondary | ICD-10-CM | POA: Diagnosis not present

## 2022-10-13 DIAGNOSIS — E039 Hypothyroidism, unspecified: Secondary | ICD-10-CM | POA: Diagnosis not present

## 2022-10-13 DIAGNOSIS — E559 Vitamin D deficiency, unspecified: Secondary | ICD-10-CM | POA: Diagnosis not present

## 2022-10-13 DIAGNOSIS — E538 Deficiency of other specified B group vitamins: Secondary | ICD-10-CM | POA: Diagnosis not present

## 2022-11-08 DIAGNOSIS — D649 Anemia, unspecified: Secondary | ICD-10-CM | POA: Diagnosis not present

## 2022-11-24 ENCOUNTER — Inpatient Hospital Stay: Payer: Medicare HMO | Attending: Hematology and Oncology | Admitting: Hematology and Oncology

## 2022-11-24 ENCOUNTER — Encounter: Payer: Self-pay | Admitting: Hematology and Oncology

## 2022-11-24 ENCOUNTER — Inpatient Hospital Stay: Payer: Medicare HMO

## 2022-11-24 VITALS — BP 102/67 | HR 64 | Temp 97.9°F | Resp 20 | Ht 68.0 in | Wt 169.7 lb

## 2022-11-24 DIAGNOSIS — Z87898 Personal history of other specified conditions: Secondary | ICD-10-CM | POA: Diagnosis not present

## 2022-11-24 DIAGNOSIS — D649 Anemia, unspecified: Secondary | ICD-10-CM | POA: Diagnosis not present

## 2022-11-24 DIAGNOSIS — F17210 Nicotine dependence, cigarettes, uncomplicated: Secondary | ICD-10-CM | POA: Diagnosis not present

## 2022-11-24 DIAGNOSIS — R911 Solitary pulmonary nodule: Secondary | ICD-10-CM | POA: Diagnosis not present

## 2022-11-24 DIAGNOSIS — F1010 Alcohol abuse, uncomplicated: Secondary | ICD-10-CM | POA: Diagnosis not present

## 2022-11-24 DIAGNOSIS — Z79899 Other long term (current) drug therapy: Secondary | ICD-10-CM | POA: Diagnosis not present

## 2022-11-24 LAB — RETICULOCYTES
Immature Retic Fract: 8.2 % (ref 2.3–15.9)
RBC.: 4.14 MIL/uL — ABNORMAL LOW (ref 4.22–5.81)
Retic Count, Absolute: 48.4 10*3/uL (ref 19.0–186.0)
Retic Ct Pct: 1.2 % (ref 0.4–3.1)

## 2022-11-24 LAB — CMP (CANCER CENTER ONLY)
ALT: 14 U/L (ref 0–44)
AST: 17 U/L (ref 15–41)
Albumin: 3.8 g/dL (ref 3.5–5.0)
Alkaline Phosphatase: 59 U/L (ref 38–126)
Anion gap: 10 (ref 5–15)
BUN: 16 mg/dL (ref 8–23)
CO2: 27 mmol/L (ref 22–32)
Calcium: 9.2 mg/dL (ref 8.9–10.3)
Chloride: 99 mmol/L (ref 98–111)
Creatinine: 0.85 mg/dL (ref 0.61–1.24)
GFR, Estimated: >60 mL/min (ref >60)
Glucose, Bld: 85 mg/dL (ref 70–99)
Potassium: 4 mmol/L (ref 3.5–5.1)
Sodium: 136 mmol/L (ref 135–145)
Total Bilirubin: 0.8 mg/dL (ref 0.3–1.2)
Total Protein: 7.1 g/dL (ref 6.5–8.1)

## 2022-11-24 LAB — CBC WITH DIFFERENTIAL (CANCER CENTER ONLY)
Abs Immature Granulocytes: 0.02 10*3/uL (ref 0.00–0.07)
Basophils Absolute: 0.1 10*3/uL (ref 0.0–0.1)
Basophils Relative: 1 %
Eosinophils Absolute: 0.4 10*3/uL (ref 0.0–0.5)
Eosinophils Relative: 5 %
HCT: 39.3 % (ref 39.0–52.0)
Hemoglobin: 12.5 g/dL — ABNORMAL LOW (ref 13.0–17.0)
Immature Granulocytes: 0 %
Lymphocytes Relative: 38 %
Lymphs Abs: 2.6 10*3/uL (ref 0.7–4.0)
MCH: 30.7 pg (ref 26.0–34.0)
MCHC: 31.8 g/dL (ref 30.0–36.0)
MCV: 96.6 fL (ref 80.0–100.0)
Monocytes Absolute: 0.7 10*3/uL (ref 0.1–1.0)
Monocytes Relative: 10 %
Neutro Abs: 3.1 10*3/uL (ref 1.7–7.7)
Neutrophils Relative %: 46 %
Platelet Count: 304 10*3/uL (ref 150–400)
RBC: 4.07 MIL/uL — ABNORMAL LOW (ref 4.22–5.81)
RDW: 13.2 % (ref 11.5–15.5)
WBC Count: 6.9 10*3/uL (ref 4.0–10.5)
nRBC: 0 % (ref 0.0–0.2)

## 2022-11-24 LAB — FOLATE: Folate: 12.9 ng/mL (ref >5.9)

## 2022-11-24 LAB — VITAMIN B12: Vitamin B-12: 1291 pg/mL — ABNORMAL HIGH (ref 180–914)

## 2022-11-24 LAB — DIRECT ANTIGLOBULIN TEST (NOT AT ARMC)
DAT, IgG: NEGATIVE
DAT, complement: NEGATIVE

## 2022-11-24 LAB — FERRITIN: Ferritin: 163 ng/mL (ref 24–336)

## 2022-11-24 LAB — LACTATE DEHYDROGENASE: LDH: 147 U/L (ref 98–192)

## 2022-11-24 NOTE — Progress Notes (Unsigned)
[facility] [address] [place],  [place]  [address] [phone]  Clinic Day:  11/24/2022   Referring physician: Jim Like, NP  Patient Care Team: Patient Care Team: Jim Like, NP as PCP - General (Nurse Practitioner) Thomasene Ripple, DO as PCP - Cardiology (Cardiology)   REASON FOR CONSULTATION:  Anemia  HISTORY OF PRESENT ILLNESS:  Jonathan Patterson is a 77 y.o. male with anemia who is referred in consultation by Wenda Low, NP   Medical history is notable for hypothyroidism, congestive heart failure, dilated cardiomyopathy, paroxysmal atrial fibrillation, prediabetes, benign prostatic hypertrophy-biopsies June 2021-no malignancy, vitamin B12 deficiency, vitamin D deficiency. History of diverticulitis, shingles.  Status post inguinal hernia repair, bilateral cataract surgery.    April 10, 2022: Labs at PCP  -Ferritin 182.  Vitamin B12 greater than 2000.  Hemoglobin A1c 5.7  Jun 13, 2022: Labs at PCP  -Hemoglobin 13.2.  MCV 91.  Platelets are 74,000.  WBCs 9.9, 63% neutrophils, 23% lymphocytes, 11% monocytes, 2% eosinophils and 1% basophils.  -Uric acid 5.1.  October 12, 2022: Medical management at PCP  -No specific complaints  -Not fasting, so labs delayed  November 08, 2022: Labs at PCP  -Hemoglobin 11.5.  MCV 96.  Platelets 273,000.  WBC 7.6, 52% neutrophils, 30% lymphocytes, 12% monocytes, 5% eosinophils and 1% basophils.  -Ferritin 248.  Iron 26.  TIBC 195.  Iron saturation 13%.  Uric acid 5.1.  -Increased ferrous sulfate to twice daily  -Referred to GI and hematology  November 24, 2022: Hematology [facility]  -Reports mild fatigue. Denies pica to ice, starch. Dark, non tarry stool he attributes to p.o. iron. Denies hematochezia. Bruises easily. On blood thinner. Denies NSAID use. Denies joint pain. Last colonoscopy reportedly with Dr. Charm Barges about 3 years ago with removal of polyps. CT abdomen/pelvis  in May 2022 revealed diverticulosis without diverticulitis-otherwise unremarkable  -Reports being on iron and B12 supplement for years. Taking ferrous sulfate 4 time a week now  -Scheduled to see Dr. Jennye Boroughs in December  -HgB 12.5. MCV 97. Platelets 304,000. WBC 6.9, 46% neutrophils, 38% lymphocytes, 10% monocytes, 5% eosinophils and 1% basophils. Ferritin 163. Folate 12.9. B12 1291. Coombs neg.   -Chart mentioned positive rheumatoid factor-further review of his records revealed positive rheumatoid factor, negative ANA, CT left upper extremity-numerous small erosions throughout the carpus as well as within the distal radius and ulna and involving the MCP joints. 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 Done at [facility] in 01/2020. Patient unaware, denies seeing rheumatologist in past  Social history: Past smoker-started smoking age 26, up to 2 packs per day quit age 29. Previous alcoholic started drinking at age 31, 12-24 beers daily, occasional liquor use-could not quantify, quit age 65. Married with 3 children. Lives with spouse and 63 year old grandson, who they are raising. Born in [place], has been in [place] for years. Retired from [employer]. 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  Family history: Mother had lung cancer. No known family history of anemia or hematologic disorder.  REVIEW OF SYSTEMS:  Review of Systems  Constitutional:  Positive for fatigue. Negative for appetite change, chills, fever and unexpected weight change.  HENT:   Negative for lump/mass, mouth sores and sore throat.   Respiratory:  Negative for cough and shortness of breath.   Cardiovascular:  Negative for chest pain and leg swelling.  Gastrointestinal:  Negative for abdominal pain, constipation, diarrhea, nausea and vomiting.  Genitourinary:  Negative for difficulty urinating, dysuria, frequency and hematuria.    Musculoskeletal:  Negative for arthralgias, back pain and myalgias.  Skin:  Negative for itching, rash and wound.  Neurological:  Negative for dizziness, extremity weakness, headaches, light-headedness and numbness.  Hematological:  Negative for adenopathy.  Psychiatric/Behavioral:  Negative for depression and sleep disturbance. The patient is not nervous/anxious.      VITALS:  Blood pressure 102/67, pulse 64, temperature 97.9 F (36.6 C), temperature source Oral, resp. rate 20, height 5\' 8"  (1.727 m), weight 169 lb 11.2 oz (77 kg), SpO2 99%.  Wt Readings from Last 3 Encounters:  11/24/22 169 lb 11.2 oz (77 kg)  09/22/22 177 lb 3.2 oz (80.4 kg)  11/24/21 173 lb 3.2 oz (78.6 kg)    Body mass index is 25.8 kg/m.  Performance status (ECOG): 1 - Symptomatic but completely ambulatory  PHYSICAL EXAM:  Physical Exam Vitals and nursing note reviewed.  Constitutional:      General: He is not in acute distress.    Appearance: Normal appearance. He is normal weight.  HENT:     Head: Normocephalic and atraumatic.     Mouth/Throat:     Mouth: Mucous membranes are moist.     Pharynx: Oropharynx is clear. No oropharyngeal exudate or posterior oropharyngeal erythema.  Eyes:     General: No scleral icterus.    Extraocular Movements: Extraocular movements intact.     Conjunctiva/sclera: Conjunctivae normal.     Pupils: Pupils are equal, round, and reactive to light.  Cardiovascular:     Rate and Rhythm: Normal rate and regular rhythm.     Heart sounds: Normal heart sounds. No murmur heard.    No friction rub. No gallop.  Pulmonary:     Effort: Pulmonary effort is normal.     Breath sounds: Normal breath sounds. No wheezing, rhonchi or rales.  Abdominal:     General: Bowel sounds are normal. There is no distension.     Palpations: Abdomen is soft. There is no hepatomegaly, splenomegaly or mass.     Tenderness: There is no abdominal tenderness.  Musculoskeletal:        General: Normal  range of motion.     Right hand: Deformity present.     Left hand: Deformity present.     Cervical back: Normal range of motion and neck supple. No tenderness.     Right lower leg: No edema.     Left lower leg: No edema.     Comments: Bilateral joint MCP deformities  Lymphadenopathy:     Cervical: No cervical adenopathy.     Upper Body:     Right upper body: No supraclavicular or axillary adenopathy.     Left upper body: No supraclavicular or axillary adenopathy.     Lower Body: No right inguinal adenopathy. No left inguinal adenopathy.  Skin:    General: Skin is warm and dry.     Coloration: Skin is not jaundiced.     Findings: No rash.  Neurological:     Mental Status: He is alert and oriented to person, place, and time.     Cranial Nerves: No cranial nerve deficit.  Psychiatric:        Mood and Affect: Mood normal.        Behavior: Behavior normal.        Thought Content: Thought content normal.      LABS:  Latest Ref Rng & Units 11/24/2022    2:38 PM  CBC  WBC 4.0 - 10.5 K/uL 6.9   Hemoglobin 13.0 - 17.0 g/dL 60.4   Hematocrit 54.0 - 52.0 % 39.3   Platelets 150 - 400 K/uL 304       Latest Ref Rng & Units 11/24/2022    2:38 PM 11/09/2017   10:38 AM 09/27/2017    1:55 PM  CMP  Glucose 70 - 99 mg/dL 85  86  84   BUN 8 - 23 mg/dL 16  17  15    Creatinine 0.61 - 1.24 mg/dL 9.81  1.91  4.78   Sodium 135 - 145 mmol/L 136  136  135   Potassium 3.5 - 5.1 mmol/L 4.0  4.3  5.3   Chloride 98 - 111 mmol/L 99  94  94   CO2 22 - 32 mmol/L 27  26  27    Calcium 8.9 - 10.3 mg/dL 9.2  9.7  9.7   Total Protein 6.5 - 8.1 g/dL 7.1     Total Bilirubin 0.3 - 1.2 mg/dL 0.8     Alkaline Phos 38 - 126 U/L 59     AST 15 - 41 U/L 17     ALT 0 - 44 U/L 14        No results found for: "CEA1", "CEA" / No results found for: "CEA1", "CEA" No results found for: "PSA1" No results found for: "GNF621" No results found for: "CAN125"  No results found for: "TOTALPROTELP", "ALBUMINELP",  "A1GS", "A2GS", "BETS", "BETA2SER", "GAMS", "MSPIKE", "SPEI" Lab Results  Component Value Date   FERRITIN 163 11/24/2022   Lab Results  Component Value Date   LDH 147 11/24/2022    STUDIES:  No results found.    HISTORY:   Past Medical History:  Diagnosis Date   Acute kidney injury (HCC)    Arrhythmia    Atrial fibrillation with RVR (HCC)    BPH (benign prostatic hyperplasia)    Cardiomyopathy (HCC)    Cataract, right eye 08/28/2013   Congestive heart failure (CHF) (HCC)    Diverticulitis    Elevated liver enzymes    Hypotension    Hypothyroid    Iron deficiency anemia    Mitral regurgitation    Nuclear sclerotic cataract of left eye 01/21/2017   Polyarthralgia 09/20/2022   Rheumatoid factor positive 11/24/2021   Vitamin B 12 deficiency    Vitamin D deficiency     Past Surgical History:  Procedure Laterality Date   CATARACT EXTRACTION Bilateral    HERNIA REPAIR     POLYPECTOMY  07/2019    Family History  Problem Relation Age of Onset   Diabetes Father    Diabetes Sister    Asthma Daughter     Social History:  reports that he has quit smoking. His smoking use included cigarettes. He started smoking about 58 years ago. He has a 117.6 pack-year smoking history. He has been exposed to tobacco smoke. He has never used smokeless tobacco. He reports that he does not currently use alcohol. He reports that he does not use drugs.The patient is accompanied by his wife today.  Allergies: No Known Allergies  Current Medications: Current Outpatient Medications  Medication Sig Dispense Refill   triamcinolone cream (KENALOG) 0.1 % Apply 1 Application topically as needed.     Calcium Carb-Cholecalciferol 600-20 MG-MCG TABS Take 1 tablet by mouth daily.     Cyanocobalamin (VITAMIN B12 TR PO) Take 2,500 mcg by mouth daily.  ELIQUIS 5 MG TABS tablet Take 5 mg by mouth 2 (two) times daily.     ferrous sulfate 324 MG TBEC Take 324 mg by mouth daily with breakfast. Patient  voiced taking 4 days a week Mon , Tues, Wed, and Thurs     finasteride (PROSCAR) 5 MG tablet Take 5 mg by mouth daily.     furosemide (LASIX) 40 MG tablet Take 40 mg by mouth 2 (two) times a week. Monday and Friday     levothyroxine (SYNTHROID) 137 MCG tablet Take 137 mcg by mouth daily.     lisinopril (ZESTRIL) 2.5 MG tablet Take 2.5 mg by mouth daily.     metoprolol tartrate (LOPRESSOR) 25 MG tablet Take 25 mg by mouth 2 (two) times daily.     tamsulosin (FLOMAX) 0.4 MG CAPS capsule Take 1 capsule by mouth daily.     No current facility-administered medications for this visit.     ASSESSMENT & PLAN:   Assessment/Plan:  JEDEDIAH AL is a 78 y.o. male with anemia  Anemia  -Possible causes include acute or chronic GI blood loss, chronic disease/chronic inflammation ?rheumatoid arthritis, other vitamin or trace mineral deficiency, hemolysis,  hematologic malignancy  -Will evaluate for other causes of anemia  -Awaiting GI consult  History of alcohol abuse  -Will evaluate for hepatosplenomegaly, liver masses  History of tobacco use  -Quit 24 years, CTA chest in 2019 done for new onset atrial fibrillation revealed a 5 mm right upper lobe nodule, further follow up not needed if low risk. As done after patient had quit more than 15 years, screening not recommended    I discussed the assessment and plan with the patient and his wife.  The patient was provided an opportunity to ask questions and all were answered.  The patient agreed with the plan and demonstrated an understanding of the instructions.    Thank you for the referral    105 minutes was spent in patient care.  This included time spent preparing to see the patient (e.g., review of tests), obtaining and/or reviewing separately obtained history, counseling and educating the patient/family/caregiver, ordering medications, tests, or procedures; documenting clinical information in the electronic or other health record, independently  interpreting results and communicating results to the patient/family/caregiver as well as coordination of care.      Adah Perl, PA-C   Physician Assistant [facility] [phone]

## 2022-11-25 LAB — HAPTOGLOBIN: Haptoglobin: 296 mg/dL (ref 34–355)

## 2022-11-25 LAB — AFP TUMOR MARKER: AFP, Serum, Tumor Marker: 5.3 ng/mL (ref 0.0–8.4)

## 2022-11-27 ENCOUNTER — Encounter: Payer: Self-pay | Admitting: Hematology and Oncology

## 2022-11-27 ENCOUNTER — Telehealth: Payer: Self-pay | Admitting: Hematology and Oncology

## 2022-11-27 DIAGNOSIS — D649 Anemia, unspecified: Secondary | ICD-10-CM | POA: Diagnosis not present

## 2022-11-27 LAB — KAPPA/LAMBDA LIGHT CHAINS
Kappa free light chain: 38.1 mg/L — ABNORMAL HIGH (ref 3.3–19.4)
Kappa, lambda light chain ratio: 1.67 — ABNORMAL HIGH (ref 0.26–1.65)
Lambda free light chains: 22.8 mg/L (ref 5.7–26.3)

## 2022-11-27 NOTE — Telephone Encounter (Signed)
Patient has been scheduled. Aware of appt date and time.   Scheduling Message Entered by Belva Crome A on 11/24/2022 at  2:35 PM Priority: Routine <No visit type provided>  Department: [facility]  Provider:  Scheduling Notes:  1. U/S abdomen to eval liver and spleen. Anemia with history of alcohol abuse  2. Labs and f/u with Dr. Angelene Giovanni in about 3 weeks

## 2022-11-28 LAB — ZINC: Zinc: 53 ug/dL (ref 44–115)

## 2022-11-28 LAB — COPPER, SERUM: Copper: 118 ug/dL (ref 69–132)

## 2022-11-29 LAB — MULTIPLE MYELOMA PANEL, SERUM
Albumin SerPl Elph-Mcnc: 3.1 g/dL (ref 2.9–4.4)
Albumin/Glob SerPl: 1 (ref 0.7–1.7)
Alpha 1: 0.3 g/dL (ref 0.0–0.4)
Alpha2 Glob SerPl Elph-Mcnc: 0.9 g/dL (ref 0.4–1.0)
B-Globulin SerPl Elph-Mcnc: 0.9 g/dL (ref 0.7–1.3)
Gamma Glob SerPl Elph-Mcnc: 1.2 g/dL (ref 0.4–1.8)
Globulin, Total: 3.2 g/dL (ref 2.2–3.9)
IgA: 272 mg/dL (ref 61–437)
IgG (Immunoglobin G), Serum: 1065 mg/dL (ref 603–1613)
IgM (Immunoglobulin M), Srm: 116 mg/dL (ref 15–143)
Total Protein ELP: 6.3 g/dL (ref 6.0–8.5)

## 2022-12-01 DIAGNOSIS — Z87898 Personal history of other specified conditions: Secondary | ICD-10-CM | POA: Diagnosis not present

## 2022-12-01 DIAGNOSIS — D649 Anemia, unspecified: Secondary | ICD-10-CM | POA: Diagnosis not present

## 2022-12-01 DIAGNOSIS — K7689 Other specified diseases of liver: Secondary | ICD-10-CM | POA: Diagnosis not present

## 2022-12-03 LAB — TECHNOLOGIST SMEAR REVIEW
Plt Morphology: UNDETERMINED
RBC MORPHOLOGY: UNDETERMINED
WBC MORPHOLOGY: UNDETERMINED

## 2022-12-06 DIAGNOSIS — Z0000 Encounter for general adult medical examination without abnormal findings: Secondary | ICD-10-CM | POA: Diagnosis not present

## 2022-12-06 DIAGNOSIS — Z139 Encounter for screening, unspecified: Secondary | ICD-10-CM | POA: Diagnosis not present

## 2022-12-06 DIAGNOSIS — Z9181 History of falling: Secondary | ICD-10-CM | POA: Diagnosis not present

## 2022-12-12 ENCOUNTER — Telehealth: Payer: Self-pay | Admitting: Oncology

## 2022-12-12 ENCOUNTER — Inpatient Hospital Stay: Payer: Medicare HMO | Attending: Hematology and Oncology | Admitting: Oncology

## 2022-12-12 ENCOUNTER — Inpatient Hospital Stay: Payer: Medicare HMO | Attending: Hematology and Oncology

## 2022-12-12 VITALS — BP 122/60 | HR 60 | Temp 98.1°F | Resp 14 | Ht 68.0 in | Wt 169.2 lb

## 2022-12-12 DIAGNOSIS — D500 Iron deficiency anemia secondary to blood loss (chronic): Secondary | ICD-10-CM

## 2022-12-12 DIAGNOSIS — D649 Anemia, unspecified: Secondary | ICD-10-CM | POA: Diagnosis not present

## 2022-12-12 DIAGNOSIS — I4891 Unspecified atrial fibrillation: Secondary | ICD-10-CM | POA: Insufficient documentation

## 2022-12-12 DIAGNOSIS — N400 Enlarged prostate without lower urinary tract symptoms: Secondary | ICD-10-CM | POA: Insufficient documentation

## 2022-12-12 DIAGNOSIS — R768 Other specified abnormal immunological findings in serum: Secondary | ICD-10-CM

## 2022-12-12 DIAGNOSIS — Z7901 Long term (current) use of anticoagulants: Secondary | ICD-10-CM | POA: Insufficient documentation

## 2022-12-12 DIAGNOSIS — Z79899 Other long term (current) drug therapy: Secondary | ICD-10-CM | POA: Diagnosis not present

## 2022-12-12 HISTORY — DX: Long term (current) use of anticoagulants: Z79.01

## 2022-12-12 NOTE — Progress Notes (Signed)
[facility] Cancer Follow up  Visit:  Patient Care Team: Jim Like, NP as PCP - General (Nurse Practitioner) Thomasene Ripple, DO as PCP - Cardiology (Cardiology)  CHIEF COMPLAINTS/PURPOSE OF CONSULTATION:  HISTORY OF PRESENTING ILLNESS: Jonathan Patterson 77 y.o. male is here because of anemia Medical history notable for atrial fibrillation with RVR, BPH, cardiomyopathy with CHF, diverticulitis, mitral regurgitation, elevated LFTs, positive rheumatoid factor, B12 deficiency vitamin D deficiency, history of alcohol use  April 10, 2022: Labs at PCP             -Ferritin 182.  Vitamin B12 greater than 2000.  Hemoglobin A1c 5.7   Jun 13, 2022: Labs at PCP             -Hemoglobin 13.2.  MCV 91.  Platelets are 74,000.  WBCs 9.9, 63% neutrophils, 23% lymphocytes, 11% monocytes, 2% eosinophils and 1% basophils.             -Uric acid 5.1.   November 08, 2022: WBC 7.6 Hemoglobin 11.5.  MCV 96.  Platelets 273,000.  WBC 7.6, 52% neutrophils, 30% lymphocytes, 12% monocytes, 5% eosinophils and 1% basophils. Ferritin 248.  Iron 26.  TIBC 195.  Iron saturation 13%.  Uric acid 5.1. Increased ferrous sulfate to twice daily.  Referred to GI and hematology   November 24, 2022: Hematology [facility] Has mild fatigue. Denies pica to ice, starch. Dark, non tarry stool he attributes to p.o. iron. Denies hematochezia. Bruises easily. On blood thinner. Denies NSAID use. Denies joint pain. Last colonoscopy reportedly with Dr. Charm Barges about 3 years ago with removal of polyps. CT abdomen/pelvis in May 2022 revealed diverticulosis without diverticulitis-otherwise unremarkable Has taken oral iron and B12 supplement for years. Takes ferrous sulfate 4 time a week now.  To see GI in December 2024  WBC 6.9 Hgb 12.5. MCV 97. Platelets 304,000; 46% neutrophils, 38% lymphocytes, 10% monocytes, 5% eosinophils and 1% basophils. Ferritin 163. Folate 12.9. B12 1291. Coombs neg.   Patient has  positive rheumatoid factor-further and negative ANA, CT left upper extremity-numerous small erosions throughout the carpus as well as within the distal radius and ulna and involving the MCP joints. 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 Done at [facility] in 01/2020. Has not seen a rheumatologist in past   Social history: Past smoker-started smoking age 78, up to 2 packs per day quit age 12. 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 Retired from [employer]. 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   Family history: Mother had lung cancer. No known family history of anemia or hematologic disorder.  WBC 6.9 hemoglobin 12.5 MCV 96.6 platelet count 304; 46 segs 38 lymphs 10 monos 5 eos 1 basophil.  Reticulocyte count 1.2% Direct Coombs test negative.  Haptoglobin 296 SPEP with IEP showed no paraprotein serum kappa 38.1 lambda 22.8 ratio 1.67 IgG 1065 IgA 272 IgM 116 AFP 5.3 Ferritin 163 folate 12.9 Copper 118 zinc 53 LDH 147 CMP normal  December 01 2022: Abdominal U/S -- Heterogeneous hepatic parenchymal echogenicity. This is nonspecific but can be seen in the setting of hepatic steatosis.  Spleen normal size and appearance.    December 12 2022:  Scheduled follow up for anemia.  Reviewed results of labs with patient and wife.  Taking iron sulfate 325 mg four times weekly which he is tolerating  from a GI standpoint.  Takes Calcium and iron supplements at the same time.  Remains on Eliquis due to a fib.  Does not take NSAIDS or antiplatelet medications.  No pica.    January 25 2023:  GI Consult   Review of Systems - Oncology  MEDICAL HISTORY: Past Medical History:  Diagnosis Date   Acute kidney injury (HCC)    Arrhythmia    Atrial fibrillation with RVR (HCC)    BPH (benign prostatic hyperplasia)    Cardiomyopathy (HCC)     Cataract, right eye 08/28/2013   Congestive heart failure (CHF) (HCC)    Diverticulitis    Elevated liver enzymes    Hypotension    Hypothyroid    Iron deficiency anemia    Mitral regurgitation    Nuclear sclerotic cataract of left eye 01/21/2017   Polyarthralgia 09/20/2022   Rheumatoid factor positive 11/24/2021   Vitamin B 12 deficiency    Vitamin D deficiency     SURGICAL HISTORY: Past Surgical History:  Procedure Laterality Date   CATARACT EXTRACTION Bilateral    HERNIA REPAIR     POLYPECTOMY  07/2019    SOCIAL HISTORY: Social History   Socioeconomic History   Marital status: Married    Spouse name: Not on file   Number of children: 3   Years of education: 8   Highest education level: 8th grade  Occupational History   Occupation: Water quality scientist for [employer]  Tobacco Use   Smoking status: Former    Current packs/day: 2.00    Average packs/day: 2.0 packs/day for 58.8 years (117.7 ttl pk-yrs)    Types: Cigarettes    Start date: 1966    Passive exposure: Past   Smokeless tobacco: Never  Vaping Use   Vaping status: Never Used  Substance and Sexual Activity   Alcohol use: Not Currently    Comment: Previous heavy alcohol use, quit in 1978   Drug use: Never   Sexual activity: Not on file  Other Topics Concern   Not on file  Social History Narrative   Not on file   Social Determinants of Health   Financial Resource Strain: Not on file  Food Insecurity: No Food Insecurity (11/24/2022)   Hunger Vital Sign    Worried About Running Out of Food in the Last Year: Never true    Ran Out of Food in the Last Year: Never true  Transportation Needs: No Transportation Needs (11/24/2022)   PRAPARE - Administrator, Civil Service (Medical): No    Lack of Transportation (Non-Medical): No  Physical Activity: Not on file  Stress: Not on file  Social Connections: Not on file  Intimate Partner Violence: Not At Risk (11/24/2022)    Humiliation, Afraid, Rape, and Kick questionnaire    Fear of Current or Ex-Partner: No    Emotionally Abused: No    Physically Abused: No    Sexually Abused: No    FAMILY HISTORY Family History  Problem Relation Age of Onset   Diabetes Father    Diabetes Sister    Asthma Daughter     ALLERGIES:  has No Known Allergies.  MEDICATIONS:  Current Outpatient Medications  Medication Sig Dispense Refill   Calcium Carb-Cholecalciferol 600-20 MG-MCG TABS Take 1 tablet by mouth daily.     Cyanocobalamin (VITAMIN B12 TR PO) Take 2,500 mcg by mouth daily.     ELIQUIS 5 MG TABS tablet Take 5 mg by mouth 2 (two) times daily.  ferrous sulfate 324 MG TBEC Take 324 mg by mouth daily with breakfast. Patient voiced taking 4 days a week Mon , Tues, Wed, and Thurs     finasteride (PROSCAR) 5 MG tablet Take 5 mg by mouth daily.     furosemide (LASIX) 40 MG tablet Take 40 mg by mouth 2 (two) times a week. Monday and Friday     levothyroxine (SYNTHROID) 137 MCG tablet Take 137 mcg by mouth daily.     lisinopril (ZESTRIL) 2.5 MG tablet Take 2.5 mg by mouth daily.     metoprolol tartrate (LOPRESSOR) 25 MG tablet Take 25 mg by mouth 2 (two) times daily.     tamsulosin (FLOMAX) 0.4 MG CAPS capsule Take 1 capsule by mouth daily.     triamcinolone cream (KENALOG) 0.1 % Apply 1 Application topically as needed.     No current facility-administered medications for this visit.    PHYSICAL EXAMINATION:  ECOG PERFORMANCE STATUS: 0 - Asymptomatic   Vitals:   12/12/22 0847  BP: 122/60  Pulse: 60  Resp: 14  Temp: 98.1 F (36.7 C)  SpO2: 100%    Filed Weights   12/12/22 0847  Weight: 169 lb 3.2 oz (76.7 kg)     Physical Exam Vitals and nursing note reviewed.  Constitutional:      General: He is not in acute distress.    Appearance: Normal appearance. He is normal weight. He is not ill-appearing or diaphoretic.  HENT:     Head: Normocephalic and atraumatic.     Right Ear: External ear normal.      Left Ear: External ear normal.     Nose: Nose normal.  Eyes:     General: No scleral icterus.    Conjunctiva/sclera: Conjunctivae normal.     Pupils: Pupils are equal, round, and reactive to light.  Cardiovascular:     Rate and Rhythm: Normal rate and regular rhythm.     Heart sounds: Normal heart sounds.     No friction rub. No gallop.  Pulmonary:     Effort: Pulmonary effort is normal.     Breath sounds: Normal breath sounds. No wheezing or rales.  Abdominal:     Tenderness: There is no abdominal tenderness. There is no guarding or rebound.  Musculoskeletal:        General: Normal range of motion.     Cervical back: Normal range of motion and neck supple. No rigidity or tenderness.     Right lower leg: No edema.     Left lower leg: No edema.  Lymphadenopathy:     Head:     Right side of head: No submental, submandibular, tonsillar, preauricular, posterior auricular or occipital adenopathy.     Left side of head: No submental, submandibular, tonsillar, preauricular, posterior auricular or occipital adenopathy.     Cervical: No cervical adenopathy.     Right cervical: No superficial, deep or posterior cervical adenopathy.    Left cervical: No superficial, deep or posterior cervical adenopathy.     Upper Body:     Right upper body: No supraclavicular or axillary adenopathy.     Left upper body: No supraclavicular or axillary adenopathy.     Lower Body: No right inguinal adenopathy. No left inguinal adenopathy.  Skin:    Coloration: Skin is not jaundiced.     Findings: No lesion or rash.  Neurological:     General: No focal deficit present.     Mental Status: He is alert and oriented to person,  place, and time.  Psychiatric:        Mood and Affect: Mood normal.        Behavior: Behavior normal.        Thought Content: Thought content normal.        Judgment: Judgment normal.      LABORATORY DATA: I have personally reviewed the data as listed:  Appointment on  11/24/2022  Component Date Value Ref Range Status   WBC MORPHOLOGY 11/24/2022 UNABLE TO PERFORM TEST DUE TO LABORATORY ERROR   Corrected   Comment: PATIENT IDENTIFICATION ERROR. PLEASE DISREGARD RESULTS. ACCOUNT WILL BE CREDITED. CORRECTED ON 10/27 AT 1641: PREVIOUSLY REPORTED AS Abnormal lymphocytes present Moderate Left Shift (>5% metas and myelos) VACUOLATED NEUTROPHILS    RBC MORPHOLOGY 11/24/2022 UNABLE TO PERFORM TEST DUE TO LABORATORY ERROR   Corrected   Comment: PATIENT IDENTIFICATION ERROR. PLEASE DISREGARD RESULTS. ACCOUNT WILL BE CREDITED. CORRECTED ON 10/27 AT 1641: PREVIOUSLY REPORTED AS BURR CELLS OVALOCYTES POLYCHROMASIA PRESENT    Plt Morphology 11/24/2022 UNABLE TO PERFORM TEST DUE TO LABORATORY ERROR   Corrected   Comment: PATIENT IDENTIFICATION ERROR. PLEASE DISREGARD RESULTS. ACCOUNT WILL BE CREDITED. CORRECTED ON 10/27 AT 1641: PREVIOUSLY REPORTED AS Normal platelet morphology    Clinical Information 11/24/2022 anemia   Final   Performed at [facility], [address] [place], [place] [address]   AFP, Serum, Tumor Marker 11/24/2022 5.3  0.0 - 8.4 ng/mL Final   Comment: (NOTE) Roche Diagnostics Electrochemiluminescence Immunoassay (ECLIA) Values obtained with different assay methods or kits cannot be used interchangeably.  Results cannot be interpreted as absolute evidence of the presence or absence of malignant disease. This test is not interpretable in pregnant females. Performed At: [facility] [address] [place], [place] [id number] Jolene Schimke MD [id number]:[id number]    Kappa free light chain 11/24/2022 38.1 (H)  3.3 - 19.4 mg/L Final   Lambda free light chains 11/24/2022 22.8  5.7 - 26.3 mg/L Final   Kappa, lambda light chain ratio 11/24/2022 1.67 (H)  0.26 - 1.65 Final   Comment: (NOTE) Performed At: [facility] [address] [place], [place] [id number] Jolene Schimke MD [id number]    IgG (Immunoglobin G), Serum  11/24/2022 1,065  603 - 1,613 mg/dL Final   IgA 40/11/2723 272  61 - 437 mg/dL Final   IgM (Immunoglobulin M), Srm 11/24/2022 116  15 - 143 mg/dL Final   Total Protein ELP 11/24/2022 6.3  6.0 - 8.5 g/dL Corrected   Albumin SerPl Elph-Mcnc 11/24/2022 3.1  2.9 - 4.4 g/dL Corrected   Alpha 1 36/64/4034 0.3  0.0 - 0.4 g/dL Corrected   Alpha2 Glob SerPl Elph-Mcnc 11/24/2022 0.9  0.4 - 1.0 g/dL Corrected   B-Globulin SerPl Elph-Mcnc 11/24/2022 0.9  0.7 - 1.3 g/dL Corrected   Gamma Glob SerPl Elph-Mcnc 11/24/2022 1.2  0.4 - 1.8 g/dL Corrected   M Protein SerPl Elph-Mcnc 11/24/2022 Not Observed  Not Observed g/dL Corrected   Globulin, Total 11/24/2022 3.2  2.2 - 3.9 g/dL Corrected   Albumin/Glob SerPl 11/24/2022 1.0  0.7 - 1.7 Corrected   IFE 1 11/24/2022 Comment   Corrected   Comment: (NOTE) The immunofixation pattern appears unremarkable. Evidence of monoclonal protein is not apparent.    Please Note 11/24/2022 Comment   Corrected   Comment: (NOTE) Protein electrophoresis scan will follow via computer, mail, or courier delivery. Performed At: [facility] [address] [place], [place] [id number] Jolene Schimke MD VF:[id number]    Retic Ct Pct  11/24/2022 1.2  0.4 - 3.1 % Final   RBC. 11/24/2022 4.14 (L)  4.22 - 5.81 MIL/uL Final   Retic Count, Absolute 11/24/2022 48.4  19.0 - 186.0 K/uL Final   Immature Retic Fract 11/24/2022 8.2  2.3 - 15.9 % Final   Performed at [facility], [address], [place], [place] [address]   Zinc 11/24/2022 53  44 - 115 ug/dL Final   Comment: (NOTE) This test was developed and its performance characteristics determined by [facility]. It has not been cleared or approved by the Food and Drug Administration.                                Detection Limit = 5 Performed At: [facility] [address] [place], [place] [id number] Jolene Schimke MD [id number]    DAT, complement 11/24/2022 NEG   Final   DAT, IgG 11/24/2022     Final                   Value:NEG Performed at [facility], [address], [place], [place] [address]    Copper 11/24/2022 118  69 - 132 ug/dL Final   Comment: (NOTE) This test was developed and its performance characteristics determined by [facility]. It has not been cleared or approved by the Food and Drug Administration.                                Detection Limit = 5 Performed At: [facility] [address] [place], [place] [id number] Jolene Schimke MD [id number]    Haptoglobin 11/24/2022 296  34 - 355 mg/dL Final   Comment: (NOTE) Performed At: [facility] [address] [place], [place] [id number] Jolene Schimke MD [id number]    Sodium 11/24/2022 136  135 - 145 mmol/L Final   Potassium 11/24/2022 4.0  3.5 - 5.1 mmol/L Final   Chloride 11/24/2022 99  98 - 111 mmol/L Final   CO2 11/24/2022 27  22 - 32 mmol/L Final   Glucose, Bld 11/24/2022 85  70 - 99 mg/dL Final   Glucose reference range applies only to samples taken after fasting for at least 8 hours.   BUN 11/24/2022 16  8 - 23 mg/dL Final   Creatinine 22/03/5425 0.85  0.61 - 1.24 mg/dL Final   Calcium 07/30/7626 9.2  8.9 - 10.3 mg/dL Final   Total Protein 31/51/7616 7.1  6.5 - 8.1 g/dL Final   Albumin 07/37/1062 3.8  3.5 - 5.0 g/dL Final   AST 69/48/5462 17  15 - 41 U/L Final   ALT 11/24/2022 14  0 - 44 U/L Final   Alkaline Phosphatase 11/24/2022 59  38 - 126 U/L Final   Total Bilirubin 11/24/2022 0.8  0.3 - 1.2 mg/dL Final   GFR, Estimated 11/24/2022 >60  >60 mL/min Final   Comment: (NOTE) Calculated using the CKD-EPI Creatinine Equation (2021)    Anion gap 11/24/2022 10  5 - 15 Final   Performed at [facility], [address] [place], [place] [address]   WBC Count 11/24/2022 6.9  4.0 - 10.5 K/uL Final   RBC 11/24/2022 4.07 (L)  4.22 - 5.81 MIL/uL Final   Hemoglobin 11/24/2022 12.5 (L)  13.0 - 17.0 g/dL Final   HCT 09/38/1829 39.3  39.0 - 52.0 %  Final   MCV 11/24/2022 96.6  80.0 - 100.0 fL Final   MCH 11/24/2022 30.7  26.0 - 34.0 pg Final   MCHC 11/24/2022 31.8  30.0 - 36.0 g/dL Final   RDW 16/11/9602 13.2  11.5 - 15.5 % Final   Platelet Count 11/24/2022 304  150 - 400 K/uL Final   nRBC 11/24/2022 0.0  0.0 - 0.2 % Final   Neutrophils Relative % 11/24/2022 46  % Final   Neutro Abs 11/24/2022 3.1  1.7 - 7.7 K/uL Final   Lymphocytes Relative 11/24/2022 38  % Final   Lymphs Abs 11/24/2022 2.6  0.7 - 4.0 K/uL Final   Monocytes Relative 11/24/2022 10  % Final   Monocytes Absolute 11/24/2022 0.7  0.1 - 1.0 K/uL Final   Eosinophils Relative 11/24/2022 5  % Final   Eosinophils Absolute 11/24/2022 0.4  0.0 - 0.5 K/uL Final   Basophils Relative 11/24/2022 1  % Final   Basophils Absolute 11/24/2022 0.1  0.0 - 0.1 K/uL Final   Immature Granulocytes 11/24/2022 0  % Final   Abs Immature Granulocytes 11/24/2022 0.02  0.00 - 0.07 K/uL Final   Performed at [facility], [address] [place], [place] [address]   LDH 11/24/2022 147  98 - 192 U/L Final   Performed at [facility], [address], [place], [place] [address]   Vitamin B-12 11/24/2022 1,291 (H)  180 - 914 pg/mL Final   Comment: (NOTE) This assay is not validated for testing neonatal or myeloproliferative syndrome specimens for Vitamin B12 levels. Performed at [facility], [address], [place], [place] [address]    Folate 11/24/2022 12.9  >5.9 ng/mL Final   Performed at [facility], [address], [place], [place] [address]   Ferritin 11/24/2022 163  24 - 336 ng/mL Final   Performed at [facility], [address] [place], [place] [address]    RADIOGRAPHIC STUDIES: I have personally reviewed the radiological images as listed and agree with the findings in the report  No results found.  ASSESSMENT/PLAN  77 y.o. male is here because of anemia.  Medical history notable for  atrial fibrillation with RVR, BPH, cardiomyopathy with CHF, diverticulitis, mitral regurgitation, elevated LFTs, positive rheumatoid factor, B12 deficiency vitamin D deficiency, remote alcohol use  Anemia:  Main etiology appears to be iron deficiency from occult GI blood loss exacerbated by anticoagulation   November 24 2022- Hgb 12.5 Ferritin 163  December 12 2022- Continue oral iron replacement, recommended that this be done in form of MVI with iron daily as tolerated.  Can hold on IV iron at this time  January 25 2023- GI consult for consideration of endoscopy  Abnormal liver U/S- Likely due to history of excessive EtOH use in the past  November 24 2022- AFP 5.3 December 01 2022: Abdominal U/S -- Heterogeneous hepatic parenchymal echogenicity. This is nonspecific but can be seen in the setting of hepatic steatosis.  Spleen normal size and appearance.   December 12 2022- Will follow clinically  Positive Rheumatoid factor  December 12 2022- Does not appear to have active RA   Cancer Staging  No matching staging information was found for the patient.    No problem-specific Assessment & Plan notes found for this encounter.    No orders of the defined types were placed in this encounter.   31  minutes was spent in patient care.  This included time spent preparing to see the patient (e.g., review of tests),  obtaining and/or reviewing separately obtained history, counseling and educating the patient/family/caregiver, ordering medications, tests, or procedures; documenting clinical information in the electronic or other health record, independently interpreting results and communicating results to the patient/family/caregiver as well as coordination of care.       All questions were answered. The patient knows to call the clinic with any problems, questions or concerns.  This note was electronically signed.    Loni Muse, MD  12/12/2022 9:14 AM

## 2022-12-12 NOTE — Telephone Encounter (Signed)
12/12/22 NEXT APPT SCHEDULED AND CONFIRMED WITH PATIENT

## 2022-12-12 NOTE — Patient Instructions (Signed)
Instead of iron sulfate, please switch to either a women's multivitamin with iron or children's multivitamin with iron.  Please do not take this with you calcium or synthroid.

## 2022-12-18 ENCOUNTER — Encounter: Payer: Self-pay | Admitting: Hematology and Oncology

## 2023-01-16 DIAGNOSIS — I5030 Unspecified diastolic (congestive) heart failure: Secondary | ICD-10-CM | POA: Diagnosis not present

## 2023-01-16 DIAGNOSIS — Z23 Encounter for immunization: Secondary | ICD-10-CM | POA: Diagnosis not present

## 2023-01-16 DIAGNOSIS — I4891 Unspecified atrial fibrillation: Secondary | ICD-10-CM | POA: Diagnosis not present

## 2023-01-16 DIAGNOSIS — Z6823 Body mass index (BMI) 23.0-23.9, adult: Secondary | ICD-10-CM | POA: Diagnosis not present

## 2023-01-16 DIAGNOSIS — R7303 Prediabetes: Secondary | ICD-10-CM | POA: Diagnosis not present

## 2023-01-16 DIAGNOSIS — N400 Enlarged prostate without lower urinary tract symptoms: Secondary | ICD-10-CM | POA: Diagnosis not present

## 2023-01-16 DIAGNOSIS — E063 Autoimmune thyroiditis: Secondary | ICD-10-CM | POA: Diagnosis not present

## 2023-01-16 DIAGNOSIS — E538 Deficiency of other specified B group vitamins: Secondary | ICD-10-CM | POA: Diagnosis not present

## 2023-01-16 DIAGNOSIS — D649 Anemia, unspecified: Secondary | ICD-10-CM | POA: Diagnosis not present

## 2023-01-16 DIAGNOSIS — Z125 Encounter for screening for malignant neoplasm of prostate: Secondary | ICD-10-CM | POA: Diagnosis not present

## 2023-01-25 DIAGNOSIS — D509 Iron deficiency anemia, unspecified: Secondary | ICD-10-CM | POA: Diagnosis not present

## 2023-01-25 DIAGNOSIS — Z7901 Long term (current) use of anticoagulants: Secondary | ICD-10-CM | POA: Diagnosis not present

## 2023-01-25 DIAGNOSIS — K5790 Diverticulosis of intestine, part unspecified, without perforation or abscess without bleeding: Secondary | ICD-10-CM | POA: Diagnosis not present

## 2023-02-06 ENCOUNTER — Telehealth: Payer: Self-pay

## 2023-02-06 NOTE — Telephone Encounter (Signed)
 Patient with diagnosis of afib on Eliquis for anticoagulation.    Procedure: Colonoscopy and EGD  Date of procedure: 03/16/23     CHA2DS2-VASc Score = 3   This indicates a 3.2% annual risk of stroke. The patient's score is based upon: CHF History: 1 HTN History: 0 Diabetes History: 0 Stroke History: 0 Vascular Disease History: 0 Age Score: 2 Gender Score: 0     CrCl 81 mL/min Platelet count 319 K    Per office protocol, patient can hold Eliquis for 2 days prior to procedure.     **This guidance is not considered finalized until pre-operative APP has relayed final recommendations.**

## 2023-02-06 NOTE — Telephone Encounter (Signed)
   Patient Name: Jonathan Patterson  DOB: [birth year] MRN: [id number]  Primary Cardiologist: Kardie Tobb, DO  Chart reviewed as part of pre-operative protocol coverage. Pre-op clearance already addressed by colleagues in earlier phone notes. To summarize recommendations:  -Per office protocol, patient can hold Eliquis for 2 days prior to procedure.  Please resume when safe to do so.   No medical clearance requested.   Will route this bundled recommendation to requesting provider via Epic fax function and remove from pre-op pool. Please call with questions.  Orren LOISE Fabry, PA-C 02/06/2023, 4:54 PM

## 2023-02-06 NOTE — Telephone Encounter (Signed)
   Pre-operative Risk Assessment    Patient Name: Jonathan Patterson  DOB: 1945-09-12 MRN: [id number]   Date of last office visit: 09/22/22 Date of next office visit: N/A   Request for Surgical Clearance    Procedure:   Colonoscopy and EGD  Date of Surgery:  Clearance 03/16/23                                Surgeon:  Dr. Evalene Misenheimer Surgeon's Group or Practice Name:  [facility] Phone number:  [phone] Fax number:  [phone]   Type of Clearance Requested:   - Pharmacy:  Hold Apixaban (Eliquis) 2 days prior    Type of Anesthesia:  Not Indicated   Additional requests/questions:    Bonney Annabella LITTIE Dorlene   02/06/2023, 9:56 AM

## 2023-03-13 ENCOUNTER — Other Ambulatory Visit: Payer: Self-pay | Admitting: Oncology

## 2023-03-13 DIAGNOSIS — D649 Anemia, unspecified: Secondary | ICD-10-CM

## 2023-03-13 NOTE — Progress Notes (Signed)
 [facility]  [address] [place],  [place]  [address] [phone]  Clinic Day:  03/14/2023  Referring physician: [name], NP   HISTORY OF PRESENT ILLNESS:  The patient is a 78 y.o. male with anemia, with a component related to iron deficiency.  Of note, this gentleman takes iron pills 4 times per week.  He denies having any overt forms of blood loss which concerns him for progressive anemia.  The patient is scheduled to undergo a colonoscopy later this week.    PHYSICAL EXAM:  Blood pressure 137/81, pulse 72, temperature 97.6 F (36.4 C), temperature source Oral, resp. rate 16, height 5' 8 (1.727 m), weight 168 lb 1.6 oz (76.2 kg), SpO2 95%. Wt Readings from Last 3 Encounters:  03/14/23 168 lb 1.6 oz (76.2 kg)  12/12/22 169 lb 3.2 oz (76.7 kg)  11/24/22 169 lb 11.2 oz (77 kg)   Body mass index is 25.56 kg/m. Performance status (ECOG): 1 - Symptomatic but completely ambulatory Physical Exam  LABS:      Latest Ref Rng & Units 03/14/2023   10:32 AM 11/24/2022    2:38 PM  CBC  WBC 4.0 - 10.5 K/uL 7.5  6.9   Hemoglobin 13.0 - 17.0 g/dL 85.9  87.4   Hematocrit 39.0 - 52.0 % 42.1  39.3   Platelets 150 - 400 K/uL 278  304       Latest Ref Rng & Units 03/14/2023   10:32 AM 11/24/2022    2:38 PM 11/09/2017   10:38 AM  CMP  Glucose 70 - 99 mg/dL 92  85  86   BUN 8 - 23 mg/dL 23  16  17    Creatinine 0.61 - 1.24 mg/dL 9.14  9.14  9.19   Sodium 135 - 145 mmol/L 136  136  136   Potassium 3.5 - 5.1 mmol/L 4.7  4.0  4.3   Chloride 98 - 111 mmol/L 99  99  94   CO2 22 - 32 mmol/L 28  27  26    Calcium 8.9 - 10.3 mg/dL 89.9  9.2  9.7   Total Protein 6.5 - 8.1 g/dL 7.1  7.1    Total Bilirubin 0.0 - 1.2 mg/dL 0.4  0.8    Alkaline Phos 38 - 126 U/L 69  59    AST 15 - 41 U/L 22  17    ALT 0 - 44 U/L 11  14      Latest Reference Range & Units 03/14/23 10:32 03/14/23 10:33  Iron 45 - 182 ug/dL  62  UIBC ug/dL  756  TIBC 749 - 549 ug/dL  694   Saturation Ratios 17.9 - 39.5 %  20  Ferritin 24 - 336 ng/mL  142  Folate >5.9 ng/mL 17.4   Vitamin B12 180 - 914 pg/mL 1,713 (H)   (H): Data is abnormally high   ASSESSMENT & PLAN:  Assessment/Plan:  A 78 y.o. male with a history of mild anemia.  However, I am very pleased with his hemoglobin of 14 today.  Furthermore, his iron parameters look great.  Based upon this, I have no problem with him continuing to take an iron pill 4 times per week.   He already has a colonoscopy coming up in a few days to ensure no ominous GI tract process is factoring into his prior anemia.  Otherwise, as he is doing well, I will see him back in 6 months for repeat clinical assessment.  The patient understands all the plans discussed today and is in agreement with them.     Alanea Woolridge DELENA Kerns, MD

## 2023-03-14 ENCOUNTER — Ambulatory Visit: Payer: Medicare HMO | Admitting: Oncology

## 2023-03-14 ENCOUNTER — Inpatient Hospital Stay: Payer: PPO | Attending: Hematology and Oncology | Admitting: Oncology

## 2023-03-14 ENCOUNTER — Other Ambulatory Visit: Payer: Self-pay | Admitting: Oncology

## 2023-03-14 ENCOUNTER — Inpatient Hospital Stay: Payer: PPO

## 2023-03-14 ENCOUNTER — Other Ambulatory Visit: Payer: Medicare HMO

## 2023-03-14 VITALS — BP 137/81 | HR 72 | Temp 97.6°F | Resp 16 | Ht 68.0 in | Wt 168.1 lb

## 2023-03-14 DIAGNOSIS — D508 Other iron deficiency anemias: Secondary | ICD-10-CM | POA: Diagnosis not present

## 2023-03-14 DIAGNOSIS — D509 Iron deficiency anemia, unspecified: Secondary | ICD-10-CM | POA: Insufficient documentation

## 2023-03-14 DIAGNOSIS — D649 Anemia, unspecified: Secondary | ICD-10-CM

## 2023-03-14 DIAGNOSIS — D500 Iron deficiency anemia secondary to blood loss (chronic): Secondary | ICD-10-CM

## 2023-03-14 LAB — CBC WITH DIFFERENTIAL (CANCER CENTER ONLY)
Abs Immature Granulocytes: 0.03 10*3/uL (ref 0.00–0.07)
Basophils Absolute: 0.1 10*3/uL (ref 0.0–0.1)
Basophils Relative: 1 %
Eosinophils Absolute: 0.3 10*3/uL (ref 0.0–0.5)
Eosinophils Relative: 4 %
HCT: 42.1 % (ref 39.0–52.0)
Hemoglobin: 14 g/dL (ref 13.0–17.0)
Immature Granulocytes: 0 %
Lymphocytes Relative: 38 %
Lymphs Abs: 2.8 10*3/uL (ref 0.7–4.0)
MCH: 30.3 pg (ref 26.0–34.0)
MCHC: 33.3 g/dL (ref 30.0–36.0)
MCV: 91.1 fL (ref 80.0–100.0)
Monocytes Absolute: 0.8 10*3/uL (ref 0.1–1.0)
Monocytes Relative: 11 %
Neutro Abs: 3.5 10*3/uL (ref 1.7–7.7)
Neutrophils Relative %: 46 %
Platelet Count: 278 10*3/uL (ref 150–400)
RBC: 4.62 MIL/uL (ref 4.22–5.81)
RDW: 13.2 % (ref 11.5–15.5)
WBC Count: 7.5 10*3/uL (ref 4.0–10.5)
nRBC: 0 % (ref 0.0–0.2)
nRBC: 0 /100{WBCs}

## 2023-03-14 LAB — CMP (CANCER CENTER ONLY)
ALT: 11 U/L (ref 0–44)
AST: 22 U/L (ref 15–41)
Albumin: 4 g/dL (ref 3.5–5.0)
Alkaline Phosphatase: 69 U/L (ref 38–126)
Anion gap: 9 (ref 5–15)
BUN: 23 mg/dL (ref 8–23)
CO2: 28 mmol/L (ref 22–32)
Calcium: 10 mg/dL (ref 8.9–10.3)
Chloride: 99 mmol/L (ref 98–111)
Creatinine: 0.85 mg/dL (ref 0.61–1.24)
GFR, Estimated: >60 mL/min (ref >60)
Glucose, Bld: 92 mg/dL (ref 70–99)
Potassium: 4.7 mmol/L (ref 3.5–5.1)
Sodium: 136 mmol/L (ref 135–145)
Total Bilirubin: 0.4 mg/dL (ref 0.0–1.2)
Total Protein: 7.1 g/dL (ref 6.5–8.1)

## 2023-03-14 LAB — IRON AND TIBC
Iron: 62 ug/dL (ref 45–182)
Saturation Ratios: 20 % (ref 17.9–39.5)
TIBC: 305 ug/dL (ref 250–450)
UIBC: 243 ug/dL

## 2023-03-14 LAB — FOLATE: Folate: 17.4 ng/mL (ref >5.9)

## 2023-03-14 LAB — VITAMIN B12: Vitamin B-12: 1713 pg/mL — ABNORMAL HIGH (ref 180–914)

## 2023-03-14 LAB — FERRITIN: Ferritin: 142 ng/mL (ref 24–336)

## 2023-03-16 DIAGNOSIS — K5730 Diverticulosis of large intestine without perforation or abscess without bleeding: Secondary | ICD-10-CM | POA: Diagnosis not present

## 2023-03-16 DIAGNOSIS — K3189 Other diseases of stomach and duodenum: Secondary | ICD-10-CM | POA: Diagnosis not present

## 2023-03-16 DIAGNOSIS — D509 Iron deficiency anemia, unspecified: Secondary | ICD-10-CM | POA: Diagnosis not present

## 2023-03-16 DIAGNOSIS — K449 Diaphragmatic hernia without obstruction or gangrene: Secondary | ICD-10-CM | POA: Diagnosis not present

## 2023-03-16 DIAGNOSIS — I10 Essential (primary) hypertension: Secondary | ICD-10-CM | POA: Diagnosis not present

## 2023-03-16 DIAGNOSIS — K5750 Diverticulosis of both small and large intestine without perforation or abscess without bleeding: Secondary | ICD-10-CM | POA: Diagnosis not present

## 2023-04-13 DIAGNOSIS — Z9849 Cataract extraction status, unspecified eye: Secondary | ICD-10-CM | POA: Diagnosis not present

## 2023-04-13 DIAGNOSIS — I10 Essential (primary) hypertension: Secondary | ICD-10-CM | POA: Diagnosis not present

## 2023-04-13 DIAGNOSIS — D509 Iron deficiency anemia, unspecified: Secondary | ICD-10-CM | POA: Diagnosis not present

## 2023-04-13 DIAGNOSIS — Z87891 Personal history of nicotine dependence: Secondary | ICD-10-CM | POA: Diagnosis not present

## 2023-04-13 DIAGNOSIS — R972 Elevated prostate specific antigen [PSA]: Secondary | ICD-10-CM | POA: Diagnosis not present

## 2023-04-13 DIAGNOSIS — I509 Heart failure, unspecified: Secondary | ICD-10-CM | POA: Diagnosis not present

## 2023-04-13 DIAGNOSIS — N400 Enlarged prostate without lower urinary tract symptoms: Secondary | ICD-10-CM | POA: Diagnosis not present

## 2023-04-13 DIAGNOSIS — K5790 Diverticulosis of intestine, part unspecified, without perforation or abscess without bleeding: Secondary | ICD-10-CM | POA: Diagnosis not present

## 2023-04-13 DIAGNOSIS — E039 Hypothyroidism, unspecified: Secondary | ICD-10-CM | POA: Diagnosis not present

## 2023-04-13 DIAGNOSIS — N401 Enlarged prostate with lower urinary tract symptoms: Secondary | ICD-10-CM | POA: Diagnosis not present

## 2023-04-13 DIAGNOSIS — I110 Hypertensive heart disease with heart failure: Secondary | ICD-10-CM | POA: Diagnosis not present

## 2023-04-18 DIAGNOSIS — E063 Autoimmune thyroiditis: Secondary | ICD-10-CM | POA: Diagnosis not present

## 2023-04-18 DIAGNOSIS — N400 Enlarged prostate without lower urinary tract symptoms: Secondary | ICD-10-CM | POA: Diagnosis not present

## 2023-04-18 DIAGNOSIS — D649 Anemia, unspecified: Secondary | ICD-10-CM | POA: Diagnosis not present

## 2023-04-18 DIAGNOSIS — I5030 Unspecified diastolic (congestive) heart failure: Secondary | ICD-10-CM | POA: Diagnosis not present

## 2023-04-18 DIAGNOSIS — R7303 Prediabetes: Secondary | ICD-10-CM | POA: Diagnosis not present

## 2023-04-18 DIAGNOSIS — I4891 Unspecified atrial fibrillation: Secondary | ICD-10-CM | POA: Diagnosis not present

## 2023-04-18 DIAGNOSIS — E559 Vitamin D deficiency, unspecified: Secondary | ICD-10-CM | POA: Diagnosis not present

## 2023-04-24 ENCOUNTER — Other Ambulatory Visit: Payer: Self-pay | Admitting: Urology

## 2023-04-24 DIAGNOSIS — R972 Elevated prostate specific antigen [PSA]: Secondary | ICD-10-CM

## 2023-06-07 DIAGNOSIS — E063 Autoimmune thyroiditis: Secondary | ICD-10-CM | POA: Diagnosis not present

## 2023-06-08 ENCOUNTER — Ambulatory Visit
Admission: RE | Admit: 2023-06-08 | Discharge: 2023-06-08 | Disposition: A | Discharge status: Home / Self Care | Source: Ambulatory Visit | Attending: Urology | Admitting: Urology

## 2023-06-08 DIAGNOSIS — K5730 Diverticulosis of large intestine without perforation or abscess without bleeding: Secondary | ICD-10-CM | POA: Diagnosis not present

## 2023-06-08 DIAGNOSIS — R972 Elevated prostate specific antigen [PSA]: Secondary | ICD-10-CM

## 2023-06-08 DIAGNOSIS — N400 Enlarged prostate without lower urinary tract symptoms: Secondary | ICD-10-CM | POA: Diagnosis not present

## 2023-06-08 MED ORDER — GADOPICLENOL 0.5 MMOL/ML IV SOLN
7.0000 mL | Freq: Once | INTRAVENOUS | Status: AC | PRN
  Administered 2023-06-08: 7 mL via INTRAVENOUS

## 2023-07-23 DIAGNOSIS — D649 Anemia, unspecified: Secondary | ICD-10-CM | POA: Diagnosis not present

## 2023-07-23 DIAGNOSIS — I4891 Unspecified atrial fibrillation: Secondary | ICD-10-CM | POA: Diagnosis not present

## 2023-07-23 DIAGNOSIS — E063 Autoimmune thyroiditis: Secondary | ICD-10-CM | POA: Diagnosis not present

## 2023-07-23 DIAGNOSIS — Z6824 Body mass index (BMI) 24.0-24.9, adult: Secondary | ICD-10-CM | POA: Diagnosis not present

## 2023-07-23 DIAGNOSIS — E559 Vitamin D deficiency, unspecified: Secondary | ICD-10-CM | POA: Diagnosis not present

## 2023-07-23 DIAGNOSIS — R7303 Prediabetes: Secondary | ICD-10-CM | POA: Diagnosis not present

## 2023-07-23 DIAGNOSIS — E538 Deficiency of other specified B group vitamins: Secondary | ICD-10-CM | POA: Diagnosis not present

## 2023-07-23 DIAGNOSIS — I5030 Unspecified diastolic (congestive) heart failure: Secondary | ICD-10-CM | POA: Diagnosis not present

## 2023-07-23 LAB — LAB REPORT - SCANNED
A1c: 5.6
EGFR: 92
TSH: 8.31 — AB (ref 0.41–5.90)

## 2023-08-22 DIAGNOSIS — R7989 Other specified abnormal findings of blood chemistry: Secondary | ICD-10-CM | POA: Diagnosis not present

## 2023-08-23 LAB — LAB REPORT - SCANNED: TSH: 6.44 — AB (ref 0.41–5.90)

## 2023-09-14 ENCOUNTER — Inpatient Hospital Stay: Payer: PPO

## 2023-09-14 ENCOUNTER — Other Ambulatory Visit: Payer: Self-pay

## 2023-09-14 ENCOUNTER — Inpatient Hospital Stay: Attending: Hematology and Oncology

## 2023-09-14 ENCOUNTER — Inpatient Hospital Stay: Admitting: Hematology and Oncology

## 2023-09-14 ENCOUNTER — Inpatient Hospital Stay: Payer: PPO | Admitting: Hematology and Oncology

## 2023-09-14 ENCOUNTER — Telehealth: Payer: Self-pay | Admitting: Hematology and Oncology

## 2023-09-14 VITALS — HR 75 | Temp 98.4°F | Resp 14 | Wt 169.6 lb

## 2023-09-14 DIAGNOSIS — D509 Iron deficiency anemia, unspecified: Secondary | ICD-10-CM | POA: Diagnosis not present

## 2023-09-14 DIAGNOSIS — D649 Anemia, unspecified: Secondary | ICD-10-CM

## 2023-09-14 DIAGNOSIS — D500 Iron deficiency anemia secondary to blood loss (chronic): Secondary | ICD-10-CM

## 2023-09-14 LAB — CBC WITH DIFFERENTIAL (CANCER CENTER ONLY)
Abs Immature Granulocytes: 0.03 K/uL (ref 0.00–0.07)
Basophils Absolute: 0.1 K/uL (ref 0.0–0.1)
Basophils Relative: 1 %
Eosinophils Absolute: 0.4 K/uL (ref 0.0–0.5)
Eosinophils Relative: 5 %
HCT: 43.4 % (ref 39.0–52.0)
Hemoglobin: 14.2 g/dL (ref 13.0–17.0)
Immature Granulocytes: 0 %
Lymphocytes Relative: 16 %
Lymphs Abs: 1.3 K/uL (ref 0.7–4.0)
MCH: 30.8 pg (ref 26.0–34.0)
MCHC: 32.7 g/dL (ref 30.0–36.0)
MCV: 94.1 fL (ref 80.0–100.0)
Monocytes Absolute: 0.6 K/uL (ref 0.1–1.0)
Monocytes Relative: 8 %
Neutro Abs: 5.5 K/uL (ref 1.7–7.7)
Neutrophils Relative %: 70 %
Platelet Count: 244 K/uL (ref 150–400)
RBC: 4.61 MIL/uL (ref 4.22–5.81)
RDW: 13.1 % (ref 11.5–15.5)
WBC Count: 7.9 K/uL (ref 4.0–10.5)
nRBC: 0 % (ref 0.0–0.2)

## 2023-09-14 LAB — IRON AND TIBC
Iron: 64 ug/dL (ref 45–182)
Saturation Ratios: 21 % (ref 17.9–39.5)
TIBC: 309 ug/dL (ref 250–450)
UIBC: 245 ug/dL

## 2023-09-14 LAB — FERRITIN: Ferritin: 110 ng/mL (ref 24–336)

## 2023-09-14 NOTE — Telephone Encounter (Signed)
 Patient has been scheduled for follow-up visit per 09/14/23 LOS.  Pt given an appt calendar with date and time.

## 2023-09-14 NOTE — Progress Notes (Signed)
 [facility]  [address] [place],  [place]  [address] [phone]  Clinic Day:  09/14/2023  Referring physician: [name], NP   HISTORY OF PRESENT ILLNESS:  The patient is a 78 y.o. male with anemia, with a component related to iron deficiency.  Of note, this gentleman takes iron pills 4 times per week.  He denies having any overt forms of blood loss which concerns him for progressive anemia.  The patient is scheduled to undergo a colonoscopy later this week.    PHYSICAL EXAM:  There were no vitals taken for this visit. Wt Readings from Last 3 Encounters:  03/14/23 168 lb 1.6 oz (76.2 kg)  12/12/22 169 lb 3.2 oz (76.7 kg)  11/24/22 169 lb 11.2 oz (77 kg)   There is no height or weight on file to calculate BMI. Performance status (ECOG): 1 - Symptomatic but completely ambulatory Physical Exam Constitutional:      Appearance: Normal appearance. He is normal weight.  HENT:     Head: Normocephalic.     Mouth/Throat:     Mouth: Mucous membranes are moist.  Cardiovascular:     Rate and Rhythm: Normal rate and regular rhythm.     Pulses: Normal pulses.     Heart sounds: Normal heart sounds.  Pulmonary:     Effort: Pulmonary effort is normal.     Breath sounds: Normal breath sounds.  Abdominal:     General: Abdomen is flat. Bowel sounds are normal.     Palpations: Abdomen is soft.  Musculoskeletal:        General: Normal range of motion.     Cervical back: Normal range of motion.  Skin:    General: Skin is warm and dry.  Neurological:     General: No focal deficit present.     Mental Status: He is alert. Mental status is at baseline. He is disoriented.  Psychiatric:        Mood and Affect: Mood normal.        Behavior: Behavior normal.        Thought Content: Thought content normal.        Judgment: Judgment normal.     LABS:      Latest Ref Rng & Units 09/14/2023   11:14 AM 03/14/2023   10:32 AM 11/24/2022    2:38 PM  CBC   WBC 4.0 - 10.5 K/uL 7.9  7.5  6.9   Hemoglobin 13.0 - 17.0 g/dL 85.7  85.9  87.4   Hematocrit 39.0 - 52.0 % 43.4  42.1  39.3   Platelets 150 - 400 K/uL 244  278  304       Latest Ref Rng & Units 03/14/2023   10:32 AM 11/24/2022    2:38 PM 11/09/2017   10:38 AM  CMP  Glucose 70 - 99 mg/dL 92  85  86   BUN 8 - 23 mg/dL 23  16  17    Creatinine 0.61 - 1.24 mg/dL 9.14  9.14  9.19   Sodium 135 - 145 mmol/L 136  136  136   Potassium 3.5 - 5.1 mmol/L 4.7  4.0  4.3   Chloride 98 - 111 mmol/L 99  99  94   CO2 22 - 32 mmol/L 28  27  26    Calcium 8.9 - 10.3 mg/dL 89.9  9.2  9.7   Total Protein 6.5 - 8.1 g/dL 7.1  7.1    Total Bilirubin 0.0 - 1.2  mg/dL 0.4  0.8    Alkaline Phos 38 - 126 U/L 69  59    AST 15 - 41 U/L 22  17    ALT 0 - 44 U/L 11  14      Latest Reference Range & Units 03/14/23 10:32 03/14/23 10:33  Iron 45 - 182 ug/dL  62  UIBC ug/dL  756  TIBC 749 - 549 ug/dL  694  Saturation Ratios 17.9 - 39.5 %  20  Ferritin 24 - 336 ng/mL  142  Folate >5.9 ng/mL 17.4   Vitamin B12 180 - 914 pg/mL 1,713 (H)   (H): Data is abnormally high   ASSESSMENT & PLAN:  Assessment/Plan:  A 78 y.o. male with a history of mild anemia.  However, I am very pleased with his hemoglobin of 14.2  today. He is tolerating oral iron 4 days a week well and he will continue with this plan. Iron studies are pending from today.  Otherwise, as he is doing well, I will see him back in 6 months for repeat clinical assessment.  The patient understands all the plans discussed today and is in agreement with them.     Eleanor DELENA Bach, NP

## 2023-09-30 NOTE — Progress Notes (Unsigned)
 Cardiology Office Note:    Date:  10/05/2023   ID:  Jonathan Patterson, DOB 03/26/1945, MRN [id number]  PCP:  Jonathan Therisa RAMAN, NP  Cardiologist:  Redell Leiter, MD    Referring MD: Jonathan Therisa RAMAN, NP    ASSESSMENT:    1. PAF (paroxysmal atrial fibrillation) (HCC)   2. Heart failure with recovered ejection fraction (HFrecEF) (HCC)   3. Chronic anticoagulation   4. Orthostatic hypotension    PLAN:    In order of problems listed above:  Miachel continues to do well no clinical recurrence of atrial fibrillation since his index event associated with cardiomyopathy and heart failure He will continue his anticoagulant Previous heart failure recovered ejection fraction no findings heart failure no symptoms [place]  Heart Association class I he will continue his intermittent loop diuretic and ACE inhibitor along with beta-blocker No recurrence of symptomatic orthostatic hypotension   Next appointment: I will plan to see him in 1 year   Medication Adjustments/Labs and Tests Ordered: Current medicines are reviewed at length with the patient today.  Concerns regarding medicines are outlined above.  Orders Placed This Encounter  Procedures   EKG 12-Lead   No orders of the defined types were placed in this encounter.    History of Present Illness:    Jonathan Patterson is a 78 y.o. male with a hx of cardiomyopathy and normalization of ejection fracture heart failure paroxysmal atrial fibrillation with chronic anticoagulation and previous symptomatic orthostatic hypotension last seen show 09/22/2022.  His last echocardiogram in 2021 with an ejection fraction of 55 to 60%.  Recent lab 09/14/2023 shows a hemoglobin of 14.2 platelets 244,000, 0-5 25 potassium 4.7 sodium 136 creatinine 0.85  Compliance with diet, lifestyle and medications: Yes  He continues to do well no clinical recurrence of atrial fibrillation he is not interested in a smart watch He tolerates his anticoagulant without bleeding. He  had no cardiovascular symptoms of edema shortness of breath chest pain palpitation or syncope Recent labs from June are quite reassuring cholesterol 194 LDL 130 hemoglobin 14.2 creatinine 0.78 potassium 4.6 Past Medical History:  Diagnosis Date   Acute kidney injury (HCC)    Arrhythmia    Atrial fibrillation with RVR (HCC)    BPH (benign prostatic hyperplasia)    Cardiomyopathy (HCC)    Cataract, right eye 08/28/2013   Chronic anticoagulation 12/12/2022   Congestive heart failure (CHF) (HCC)    Diverticulitis    Elevated liver enzymes    Hypotension    Hypothyroid    Iron deficiency anemia    Mitral regurgitation    Nuclear sclerotic cataract of left eye 01/21/2017   Polyarthralgia 09/20/2022   Rheumatoid factor positive 11/24/2021   Vitamin B 12 deficiency    Vitamin D  deficiency     Current Medications: Current Meds  Medication Sig   Calcium Carb-Cholecalciferol 600-20 MG-MCG TABS Take 1 tablet by mouth daily.   Cyanocobalamin  (VITAMIN B12 TR PO) Take 2,500 mcg by mouth daily.   ELIQUIS 5 MG TABS tablet Take 5 mg by mouth 2 (two) times daily.   ferrous sulfate 324 MG TBEC Take 324 mg by mouth daily with breakfast. Patient voiced taking 4 days a week Mon , Tues, Wed, and Thurs   finasteride (PROSCAR) 5 MG tablet Take 5 mg by mouth daily.   furosemide  (LASIX ) 40 MG tablet Take 40 mg by mouth 2 (two) times a week. Monday and Friday   levothyroxine (SYNTHROID) 137 MCG tablet Take 137 mcg by  mouth daily.   lisinopril (ZESTRIL) 2.5 MG tablet Take 2.5 mg by mouth daily.   metoprolol  tartrate (LOPRESSOR ) 25 MG tablet Take 25 mg by mouth 2 (two) times daily.   tamsulosin (FLOMAX) 0.4 MG CAPS capsule Take 1 capsule by mouth daily.   triamcinolone cream (KENALOG) 0.1 % Apply 1 Application topically as needed.      EKGs/Labs/Other Studies Reviewed:    The following studies were reviewed today:  Cardiac Studies & Procedures    ______________________________________________________________________________________________     ECHOCARDIOGRAM  ECHOCARDIOGRAM COMPLETE 10/22/2017  Narrative *[facility]* [address] [place], [place] [address] [phone]  ------------------------------------------------------------------- Transthoracic Echocardiography  Patient:    Jonathan, Patterson MR #:       [id number] Study Date: 10/22/2017 Gender:     M Age:        72 Height:     182.9 cm Weight:     75.7 kg BSA:        1.96 m^2 Pt. Status: Room:  BARTON Honer, Norleen MANO ATTENDING    Redell Leiter, MD ORDERING     Redell Leiter, MD REFERRING    Redell Leiter, MD SONOGRAPHER  Rutha Silvas, RDCS PERFORMING   [facility]  cc:  ------------------------------------------------------------------- LV EF: 55% -   60%  ------------------------------------------------------------------- Indications:      CHF - 428.0.  Cardiomyopathy - dilated 425.4.  ------------------------------------------------------------------- History:   PMH:   Atrial fibrillation.  Coronary artery disease.  ------------------------------------------------------------------- Study Conclusions  - Left ventricle: The cavity size was normal. Wall thickness was normal. Systolic function was normal. The estimated ejection fraction was in the range of 55% to 60%. Wall motion was normal; there were no regional wall motion abnormalities. Doppler parameters are consistent with abnormal left ventricular relaxation (grade 1 diastolic dysfunction).  Impressions:  - V. 1. Preserved systolic function, visually estimated EF is 55-60%. Impaired relaxation.  ------------------------------------------------------------------- Study data:  No prior study was available for comparison.  Study status:  Routine.  Procedure:  The patient reported no pain pre or post test. Transthoracic echocardiography. Image quality was adequate.   Study completion:  There were no complications. Transthoracic echocardiography.  M-mode, complete 2D, spectral Doppler, and color Doppler.  Birthdate:  Patient birthdate: February 15, 1945.  Age:  Patient is 78 yr old.  Sex:  Gender: male. BMI: 22.6 kg/m^2.  Blood pressure:     104/70  Patient status: Outpatient.  Study date:  Study date: 10/22/2017. Study time: 12:58 PM.  Location:  Echo laboratory.  -------------------------------------------------------------------  ------------------------------------------------------------------- Left ventricle:  The cavity size was normal. Wall thickness was normal. Systolic function was normal. The estimated ejection fraction was in the range of 55% to 60%. Wall motion was normal; there were no regional wall motion abnormalities. Doppler parameters are consistent with abnormal left ventricular relaxation (grade 1 diastolic dysfunction).  ------------------------------------------------------------------- Aortic valve:   Trileaflet; normal thickness leaflets. Mobility was not restricted.  Doppler:  Transvalvular velocity was within the normal range. There was no stenosis. There was no regurgitation.  ------------------------------------------------------------------- Aorta:  Aortic root: The aortic root was normal in size.  ------------------------------------------------------------------- Mitral valve:   Structurally normal valve.   Mobility was not restricted.  Doppler:  Transvalvular velocity was within the normal range. There was no evidence for stenosis. There was no regurgitation.    Valve area by pressure half-time: 3.33 cm^2. Indexed valve area by pressure half-time: 1.7 cm^2/m^2.  ------------------------------------------------------------------- Left atrium:  The atrium was normal in size.  -------------------------------------------------------------------  Right ventricle:  The cavity size was normal. Wall thickness was normal.  Systolic function was normal.  ------------------------------------------------------------------- Pulmonic valve:    Doppler:  Transvalvular velocity was within the normal range. There was no evidence for stenosis.  ------------------------------------------------------------------- Tricuspid valve:   Structurally normal valve.    Doppler: Transvalvular velocity was within the normal range. There was no regurgitation.  ------------------------------------------------------------------- Pulmonary artery:   The main pulmonary artery was normal-sized. Systolic pressure was within the normal range.  ------------------------------------------------------------------- Right atrium:  The atrium was normal in size.  ------------------------------------------------------------------- Pericardium:  There was no pericardial effusion.  ------------------------------------------------------------------- Systemic veins: Inferior vena cava: The vessel was normal in size.  ------------------------------------------------------------------- Measurements  Left ventricle                           Value          Reference LV ID, ED, PLAX chordal                  46    mm       43 - 52 LV ID, ES, PLAX chordal                  36    mm       23 - 38 LV fx shortening, PLAX chordal   (L)     22    %        >=29 LV PW thickness, ED                      11    mm       ---------- IVS/LV PW ratio, ED                      1              <=1.3 Stroke volume, 2D                        52    ml       ---------- Stroke volume/bsa, 2D                    27    ml/m^2   ---------- LV ejection fraction, 1-p A4C            47    %        ---------- LV end-diastolic volume, 2-p             76    ml       ---------- LV end-systolic volume, 2-p              39    ml       ---------- LV ejection fraction, 2-p                48    %        ---------- Stroke volume, 2-p                       36    ml        ---------- LV end-diastolic volume/bsa, 2-p         39    ml/m^2   ---------- LV end-systolic volume/bsa, 2-p          20    ml/m^2   ---------- Stroke volume/bsa, 2-p  18.5  ml/m^2   ---------- LV e&', lateral                           6.64  cm/s     ---------- LV E/e&', lateral                         7.09           ---------- LV e&', medial                            4.24  cm/s     ---------- LV E/e&', medial                          11.11          ---------- LV e&', average                           5.44  cm/s     ---------- LV E/e&', average                         8.66           ----------  Ventricular septum                       Value          Reference IVS thickness, ED                        11    mm       ----------  LVOT                                     Value          Reference LVOT ID, S                               22    mm       ---------- LVOT area                                3.8   cm^2     ---------- LVOT peak velocity, S                    71.7  cm/s     ---------- LVOT mean velocity, S                    53.6  cm/s     ---------- LVOT VTI, S                              13.6  cm       ---------- LVOT peak gradient, S                    2     mm Hg    ----------  Aorta  Value          Reference Aortic root ID, ED                       38    mm       ---------- Ascending aorta ID, A-P, S               35    mm       ----------  Left atrium                              Value          Reference LA ID, A-P, ES                           35    mm       ---------- LA ID/bsa, A-P                           1.79  cm/m^2   <=2.2 LA volume, S                             34.2  ml       ---------- LA volume/bsa, S                         17.5  ml/m^2   ---------- LA volume, ES, 1-p A4C                   36.2  ml       ---------- LA volume/bsa, ES, 1-p A4C               18.5  ml/m^2   ---------- LA volume, ES, 1-p A2C                    31.8  ml       ---------- LA volume/bsa, ES, 1-p A2C               16.2  ml/m^2   ----------  Mitral valve                             Value          Reference Mitral E-wave peak velocity              47.1  cm/s     ---------- Mitral A-wave peak velocity              63.1  cm/s     ---------- Mitral deceleration time                 225   ms       150 - 230 Mitral pressure half-time                66    ms       ---------- Mitral E/A ratio, peak                   0.7            ---------- Mitral valve area, PHT, DP               3.33  cm^2     ---------- Mitral valve area/bsa, PHT, DP           1.7   cm^2/m^2 ----------  Right atrium                             Value          Reference RA ID, S-I, ES, A4C                      43.7  mm       34 - 49 RA area, ES, A4C                         13.7  cm^2     8.3 - 19.5 RA volume, ES, A/L                       34.9  ml       ---------- RA volume/bsa, ES, A/L                   17.8  ml/m^2   ----------  Systemic veins                           Value          Reference Estimated CVP                            3     mm Hg    ----------  Right ventricle                          Value          Reference TAPSE                                    16.5  mm       ---------- RV s&', lateral, S                        7.51  cm/s     ----------  Legend: (L)  and  (H)  mark values outside specified reference range.  ------------------------------------------------------------------- Prepared and Electronically Authenticated by  Jennifer Crape, MD 2019-09-17T10:31:14    MONITORS  LONG TERM MONITOR-LIVE TELEMETRY (3-14 DAYS) 03/30/2020  Narrative Patch Wear Time:  14 days and 0 hours (2022-01-27T16:35:52-0500 to 2022-02-10T16:35:52-0500)  Patient had a min HR of 55 bpm, max HR of 185 bpm, and avg HR of 79 bpm. Predominant underlying rhythm was Sinus Rhythm. 1 run of Ventricular Tachycardia occurred lasting 4 beats with a max rate of 169  bpm (avg 153 bpm). 47 Supraventricular Tachycardia runs occurred, the run with the fastest interval lasting 9 beats with a max rate of 185 bpm, the longest lasting 13.1 secs with an avg rate of 99 bpm. Isolated SVEs were occasional (1.4%, 22708), SVE Couplets were rare (<1.0%, 147), and SVE Triplets were rare (<1.0%, 137). Isolated VEs were occasional (2.2%, 35136), VE Couplets were rare (<1.0%, 4391), and VE Triplets were rare (<1.0%, 40). Ventricular Bigeminy and Trigeminy were present.  A 14-day monitor was performed to evaluate syncope. The rhythm was sinus throughout with average minimum  maximal heart rates of 79, 55 and 122 bpm. There were no pauses of 3 seconds or greater and no episodes of second or third-degree AV node block or sinus node exit block. There were 3 triggered and 2 diary events all associated with frequent PVCs. Ventricular ectopy was occasional 2.2% burden with 4391 couplets 40 triplets and one 4 beat run of PVCs. Supraventricular ectopy was occasional burden 1.4% with no episodes of atrial fibrillation or flutter. There are 47 brief runs of APCs the longest 13 seconds rate of 99 bpm fastest 9 complexes rate 185 bpm.  These were brief episodes of atrial tachycardia.  Conclusion occasional ventricular ectopy with couplets triplets and one 4 beat run of PVCs.  There were also frequent brief episodes of atrial tachycardia.   CT SCANS  CT CORONARY MORPH W/CTA COR W/SCORE 09/25/2017  Addendum 09/25/2017  4:28 PM ADDENDUM REPORT: 09/25/2017 16:26  CLINICAL DATA:  Cardiomyopathy  EXAM: Cardiac CTA  MEDICATIONS: Sub lingual nitro. 4mg  and lopressor  10mg   TECHNIQUE: The patient was scanned on a Philips 256 slice scanner. Gantry rotation speed was 270 msecs. Collimation was .9mm. A 100 kV prospective scan was triggered in the descending thoracic aorta at 111 HU's with 5% padding centered around 78% of the R-R interval. Average HR during the scan was 60 bpm. The 3D data  set was interpreted on a dedicated work station using MPR, MIP and VRT modes. A total of 80cc of contrast was used.  FINDINGS: Non-cardiac: See separate report from [facility]. No significant findings on limited lung and soft tissue windows.  Calcium Score: Mild calcium noted in proximal / mid LAD and RCA  Coronary Arteries: Right dominant with no anomalies  LM: Normal  LAD: Less than 30% mixed plaque in proximal and mid LAD  D1: Less than 30% mixed plaque in proximal vessel  D2: Normal  Circumflex: Angulated proximal vessel without stenosis  OM1: Normal  OM2: Normal  RCA: Less than 30% mixed plaque in proximal and mid vessel  PDA: Normal  PLA: Normal  IMPRESSION: 1. Calcium score 5 which is 17 th percentile for age and sex  2.  Non obstructive CAD  3.  Normal aortic root 3.6 cm  Maude Emmer   Electronically Signed By: Maude Emmer M.D. On: 09/25/2017 16:26  Narrative EXAM: OVER-READ INTERPRETATION  CT CHEST  The following report is an over-read performed by radiologist Dr. Franky Crease of [facility] on 09/25/2017. This over-read does not include interpretation of cardiac or coronary anatomy or pathology. The coronary CTA interpretation by the cardiologist is attached.  COMPARISON:  None.  FINDINGS: Vascular: Heart is normal size.  Visualized aorta normal caliber.  Mediastinum/Nodes: No adenopathy in the lower mediastinum or hila.  Lungs/Pleura: Visualized lungs clear.  No effusions.  Upper Abdomen: Imaging into the upper abdomen shows no acute findings.  Musculoskeletal: Chest wall soft tissues are unremarkable. No acute bony abnormality.  IMPRESSION: No acute or significant extracardiac abnormality.  Electronically Signed: By: Franky Crease M.D. On: 09/25/2017 16:06     ______________________________________________________________________________________________      EKG Interpretation Date/Time:  Friday  October 05 2023 12:53:29 EDT Ventricular Rate:  72 PR Interval:  194 QRS Duration:  88 QT Interval:  370 QTC Calculation: 405 R Axis:   52  Text Interpretation: Normal sinus rhythm Normal ECG When compared with ECG of 22-Sep-2022 08:21, Premature ventricular complexes are no longer Present Confirmed by Monetta Rogue ([id number]) on 10/05/2023 12:56:44 PM   Recent  Labs: 03/14/2023: ALT 11; BUN 23; Creatinine 0.85; Potassium 4.7; Sodium 136 09/14/2023: Hemoglobin 14.2; Platelet Count 244  Recent Lipid Panel No results found for: CHOL, TRIG, HDL, CHOLHDL, VLDL, LDLCALC, LDLDIRECT  Physical Exam:    VS:  BP (!) 108/58   Pulse 72   Ht 6' (1.829 m)   Wt 169 lb (76.7 kg)   SpO2 96%   BMI 22.92 kg/m     Wt Readings from Last 3 Encounters:  10/05/23 169 lb (76.7 kg)  09/14/23 169 lb 9.6 oz (76.9 kg)  03/14/23 168 lb 1.6 oz (76.2 kg)     GEN:  Well nourished, well developed in no acute distress HEENT: Normal NECK: No JVD; No carotid bruits LYMPHATICS: No lymphadenopathy CARDIAC: RRR, no murmurs, rubs, gallops RESPIRATORY:  Clear to auscultation without rales, wheezing or rhonchi  ABDOMEN: Soft, non-tender, non-distended MUSCULOSKELETAL:  No edema; No deformity  SKIN: Warm and dry NEUROLOGIC:  Alert and oriented x 3 PSYCHIATRIC:  Normal affect    Signed, Redell Leiter, MD  10/05/2023 1:09 PM    [facility]

## 2023-10-04 DIAGNOSIS — E538 Deficiency of other specified B group vitamins: Secondary | ICD-10-CM | POA: Insufficient documentation

## 2023-10-04 DIAGNOSIS — E559 Vitamin D deficiency, unspecified: Secondary | ICD-10-CM | POA: Insufficient documentation

## 2023-10-04 DIAGNOSIS — N400 Enlarged prostate without lower urinary tract symptoms: Secondary | ICD-10-CM | POA: Insufficient documentation

## 2023-10-05 ENCOUNTER — Encounter: Payer: Self-pay | Admitting: Cardiology

## 2023-10-05 ENCOUNTER — Ambulatory Visit: Attending: Cardiology | Admitting: Cardiology

## 2023-10-05 VITALS — BP 108/58 | HR 72 | Ht 72.0 in | Wt 169.0 lb

## 2023-10-05 DIAGNOSIS — I5032 Chronic diastolic (congestive) heart failure: Secondary | ICD-10-CM | POA: Diagnosis not present

## 2023-10-05 DIAGNOSIS — I951 Orthostatic hypotension: Secondary | ICD-10-CM | POA: Diagnosis not present

## 2023-10-05 DIAGNOSIS — I480 Paroxysmal atrial fibrillation: Secondary | ICD-10-CM | POA: Diagnosis not present

## 2023-10-05 DIAGNOSIS — Z7901 Long term (current) use of anticoagulants: Secondary | ICD-10-CM

## 2023-10-05 NOTE — Patient Instructions (Signed)

## 2023-10-24 DIAGNOSIS — R7303 Prediabetes: Secondary | ICD-10-CM | POA: Diagnosis not present

## 2023-10-24 DIAGNOSIS — E538 Deficiency of other specified B group vitamins: Secondary | ICD-10-CM | POA: Diagnosis not present

## 2023-10-24 DIAGNOSIS — N400 Enlarged prostate without lower urinary tract symptoms: Secondary | ICD-10-CM | POA: Diagnosis not present

## 2023-10-24 DIAGNOSIS — I5030 Unspecified diastolic (congestive) heart failure: Secondary | ICD-10-CM | POA: Diagnosis not present

## 2023-10-24 DIAGNOSIS — D649 Anemia, unspecified: Secondary | ICD-10-CM | POA: Diagnosis not present

## 2023-10-24 DIAGNOSIS — Z6823 Body mass index (BMI) 23.0-23.9, adult: Secondary | ICD-10-CM | POA: Diagnosis not present

## 2023-10-24 DIAGNOSIS — I4891 Unspecified atrial fibrillation: Secondary | ICD-10-CM | POA: Diagnosis not present

## 2023-10-24 DIAGNOSIS — E063 Autoimmune thyroiditis: Secondary | ICD-10-CM | POA: Diagnosis not present

## 2023-10-26 DIAGNOSIS — R972 Elevated prostate specific antigen [PSA]: Secondary | ICD-10-CM | POA: Diagnosis not present

## 2023-10-26 DIAGNOSIS — N401 Enlarged prostate with lower urinary tract symptoms: Secondary | ICD-10-CM | POA: Diagnosis not present

## 2023-11-21 DIAGNOSIS — E063 Autoimmune thyroiditis: Secondary | ICD-10-CM | POA: Diagnosis not present

## 2024-01-08 DIAGNOSIS — Z9181 History of falling: Secondary | ICD-10-CM | POA: Diagnosis not present

## 2024-01-08 DIAGNOSIS — Z0000 Encounter for general adult medical examination without abnormal findings: Secondary | ICD-10-CM | POA: Diagnosis not present

## 2024-01-08 DIAGNOSIS — Z1331 Encounter for screening for depression: Secondary | ICD-10-CM | POA: Diagnosis not present

## 2024-03-17 ENCOUNTER — Inpatient Hospital Stay

## 2024-03-17 ENCOUNTER — Inpatient Hospital Stay: Admitting: Oncology
